# Patient Record
Sex: Female | Born: 1960
Health system: Southern US, Community
[De-identification: ages and names within clinical notes are randomized; demographics above are authoritative.]

## PROBLEM LIST (undated history)

## (undated) DIAGNOSIS — F32A Depression, unspecified: Secondary | ICD-10-CM

## (undated) DIAGNOSIS — I10 Essential (primary) hypertension: Secondary | ICD-10-CM

## (undated) DIAGNOSIS — E785 Hyperlipidemia, unspecified: Secondary | ICD-10-CM

## (undated) DIAGNOSIS — Z5189 Encounter for other specified aftercare: Secondary | ICD-10-CM

## (undated) DIAGNOSIS — K219 Gastro-esophageal reflux disease without esophagitis: Secondary | ICD-10-CM

## (undated) DIAGNOSIS — F329 Major depressive disorder, single episode, unspecified: Secondary | ICD-10-CM

## (undated) DIAGNOSIS — Z87442 Personal history of urinary calculi: Secondary | ICD-10-CM

## (undated) DIAGNOSIS — J189 Pneumonia, unspecified organism: Secondary | ICD-10-CM

## (undated) HISTORY — DX: Hyperlipidemia, unspecified: E78.5

## (undated) HISTORY — PX: HEMORRHOID SURGERY: SHX153

## (undated) HISTORY — PX: COLONOSCOPY: SHX174

## (undated) HISTORY — PX: POLYPECTOMY: SHX149

## (undated) HISTORY — DX: Major depressive disorder, single episode, unspecified: F32.9

## (undated) HISTORY — PX: TONSILLECTOMY: SUR1361

## (undated) HISTORY — DX: Gastro-esophageal reflux disease without esophagitis: K21.9

## (undated) HISTORY — DX: Depression, unspecified: F32.A

## (undated) HISTORY — DX: Encounter for other specified aftercare: Z51.89

## (undated) HISTORY — DX: Essential (primary) hypertension: I10

---

## 1998-08-14 HISTORY — PX: WISDOM TOOTH EXTRACTION: SHX21

## 1999-02-23 ENCOUNTER — Emergency Department (HOSPITAL_COMMUNITY): Admission: EM | Admit: 1999-02-23 | Discharge: 1999-02-23 | Payer: Self-pay | Admitting: Emergency Medicine

## 1999-02-24 ENCOUNTER — Encounter: Payer: Self-pay | Admitting: Emergency Medicine

## 1999-09-14 ENCOUNTER — Encounter (INDEPENDENT_AMBULATORY_CARE_PROVIDER_SITE_OTHER): Payer: Self-pay | Admitting: Specialist

## 1999-09-14 ENCOUNTER — Other Ambulatory Visit: Admission: RE | Admit: 1999-09-14 | Discharge: 1999-09-14 | Payer: Self-pay | Admitting: *Deleted

## 2000-08-24 ENCOUNTER — Other Ambulatory Visit: Admission: RE | Admit: 2000-08-24 | Discharge: 2000-08-24 | Payer: Self-pay | Admitting: Obstetrics and Gynecology

## 2001-07-04 ENCOUNTER — Ambulatory Visit (HOSPITAL_BASED_OUTPATIENT_CLINIC_OR_DEPARTMENT_OTHER): Admission: RE | Admit: 2001-07-04 | Discharge: 2001-07-05 | Payer: Self-pay | Admitting: Surgery

## 2001-07-04 ENCOUNTER — Encounter (INDEPENDENT_AMBULATORY_CARE_PROVIDER_SITE_OTHER): Payer: Self-pay | Admitting: *Deleted

## 2005-08-01 ENCOUNTER — Emergency Department: Payer: Self-pay | Admitting: Emergency Medicine

## 2006-06-12 ENCOUNTER — Emergency Department (HOSPITAL_COMMUNITY): Admission: EM | Admit: 2006-06-12 | Discharge: 2006-06-13 | Payer: Self-pay | Admitting: Emergency Medicine

## 2006-08-16 ENCOUNTER — Other Ambulatory Visit: Admission: RE | Admit: 2006-08-16 | Discharge: 2006-08-16 | Payer: Self-pay | Admitting: Obstetrics and Gynecology

## 2006-08-31 ENCOUNTER — Encounter: Admission: RE | Admit: 2006-08-31 | Discharge: 2006-08-31 | Payer: Self-pay | Admitting: Obstetrics and Gynecology

## 2007-08-15 LAB — CONVERTED CEMR LAB: Pap Smear: ABNORMAL

## 2007-08-15 LAB — HM PAP SMEAR

## 2007-09-15 LAB — HM MAMMOGRAPHY: HM Mammogram: NORMAL

## 2008-07-20 ENCOUNTER — Ambulatory Visit: Payer: Self-pay | Admitting: Family Medicine

## 2008-07-20 DIAGNOSIS — T678XXA Other effects of heat and light, initial encounter: Secondary | ICD-10-CM | POA: Insufficient documentation

## 2008-07-20 DIAGNOSIS — F329 Major depressive disorder, single episode, unspecified: Secondary | ICD-10-CM | POA: Insufficient documentation

## 2008-07-20 DIAGNOSIS — I10 Essential (primary) hypertension: Secondary | ICD-10-CM | POA: Insufficient documentation

## 2008-08-11 ENCOUNTER — Ambulatory Visit: Payer: Self-pay | Admitting: Family Medicine

## 2008-08-12 LAB — CONVERTED CEMR LAB
BUN: 15 mg/dL (ref 6–23)
CO2: 31 meq/L (ref 19–32)
Calcium: 8.8 mg/dL (ref 8.4–10.5)
Chloride: 102 meq/L (ref 96–112)
Cholesterol: 191 mg/dL (ref 0–200)
Creatinine, Ser: 0.7 mg/dL (ref 0.4–1.2)
GFR calc Af Amer: 116 mL/min
GFR calc non Af Amer: 96 mL/min
Glucose, Bld: 103 mg/dL — ABNORMAL HIGH (ref 70–99)
HDL: 34.5 mg/dL — ABNORMAL LOW (ref >39.0)
LDL Cholesterol: 141 mg/dL — ABNORMAL HIGH (ref 0–99)
Potassium: 3.6 meq/L (ref 3.5–5.1)
Sodium: 139 meq/L (ref 135–145)
TSH: 1.67 microintl units/mL (ref 0.35–5.50)
Total CHOL/HDL Ratio: 5.5
Triglycerides: 78 mg/dL (ref 0–149)
VLDL: 16 mg/dL (ref 0–40)

## 2008-10-15 ENCOUNTER — Ambulatory Visit: Payer: Self-pay | Admitting: Family Medicine

## 2008-10-21 ENCOUNTER — Ambulatory Visit: Payer: Self-pay | Admitting: Family Medicine

## 2008-10-21 DIAGNOSIS — R109 Unspecified abdominal pain: Secondary | ICD-10-CM | POA: Insufficient documentation

## 2008-11-02 ENCOUNTER — Encounter: Payer: Self-pay | Admitting: Obstetrics and Gynecology

## 2008-11-02 ENCOUNTER — Ambulatory Visit: Payer: Self-pay | Admitting: Obstetrics and Gynecology

## 2010-09-04 ENCOUNTER — Encounter: Payer: Self-pay | Admitting: Obstetrics and Gynecology

## 2010-12-14 ENCOUNTER — Encounter: Payer: Self-pay | Admitting: Family Medicine

## 2010-12-15 ENCOUNTER — Encounter: Payer: Self-pay | Admitting: Family Medicine

## 2010-12-15 ENCOUNTER — Ambulatory Visit (INDEPENDENT_AMBULATORY_CARE_PROVIDER_SITE_OTHER): Payer: BC Managed Care – PPO | Admitting: Family Medicine

## 2010-12-15 VITALS — BP 132/90 | HR 87 | Temp 98.9°F | Ht 66.0 in | Wt 187.1 lb

## 2010-12-15 DIAGNOSIS — T678XXA Other effects of heat and light, initial encounter: Secondary | ICD-10-CM

## 2010-12-15 DIAGNOSIS — I10 Essential (primary) hypertension: Secondary | ICD-10-CM

## 2010-12-15 DIAGNOSIS — Z23 Encounter for immunization: Secondary | ICD-10-CM

## 2010-12-15 MED ORDER — HEPATITIS B VAC RECOMBINANT 5 MCG/0.5ML IJ SUSP: 0.5000 mL | Freq: Once | INTRAMUSCULAR | Status: DC

## 2010-12-15 MED ORDER — LISINOPRIL 10 MG PO TABS: 10.0000 mg | ORAL_TABLET | Freq: Every day | ORAL | Status: DC

## 2010-12-15 NOTE — Progress Notes (Signed)
50 year old female:  142/88 HTN: Has been reluctant to start meds and reluctant regarding diagnosis in the past Has machine at home, confirmed BP No CP, no sob. No HA.  BP Readings from Last 3 Encounters:  12/15/10 132/90  10/21/08 130/100  07/20/08 140/100    Hot flashes -- some over the counter products. Black Cohosh and others with good success  Also, patient needs Hep B #3  GYN is doing paps, breast, mammo  The PMH, PSH, Social History, Family History, Medications, and allergies have been reviewed in [facility], and have been updated if relevant.  ROS: GEN: No acute illnesses, no fevers, chills. GI: No n/v/d, eating normally Pulm: No SOB Interactive and getting along well at home.  Otherwise, ROS is as per the HPI.  GEN: WDWN, NAD, Non-toxic, A & O x 3 HEENT: Atraumatic, Normocephalic. Neck supple. No masses, No LAD. Ears and Nose: No external deformity. CV: RRR, No M/G/R. No JVD. No thrill. No extra heart sounds. PULM: CTA B, no wheezes, crackles, rhonchi. No retractions. No resp. distress. No accessory muscle use. EXTR: No c/c/e NEURO Normal gait.  PSYCH: Normally interactive. Conversant. Not depressed or anxious appearing.  Calm demeanor.   A/P: HTN: start lisinopril Hot flashes, continue with black cohosh. We discussed other remedies. Discussed potential antidepressants. We also discussed prior use of estrogens in the past, and but was together came to the conclusion that this is not the RIGHT choice for this patient.  Hepatitis B vaccine #3

## 2010-12-27 NOTE — Assessment & Plan Note (Signed)
NAME:  Joy Yates, MOFFITT NO.:  [id number]   MEDICAL RECORD NO.:  [id number]          PATIENT TYPE:  POB   LOCATION:  [facility]         FACILITY:  [facility]   PHYSICIAN:  Argentina Donovan, MD        DATE OF BIRTH:  28-Feb-1961   DATE OF SERVICE:  11/02/2008                                  CLINIC NOTE   The patient is a 50 year old Caucasian female, nulligravida, married,  and works in [employer].  Since she had her menarche at age 14, she  has only had 2-3 periods a year, has been fully worked up in the past,  and had seen infertility specialist many years ago who wanted to place  her on [facility] and thus she decided against it because of price.  She is in  extremely good health.   She has no known allergies.   The only medication she is on now is penicillin because she just  recently had a root canal work.   The only surgery she has had is 2 hemorrhoid operations and has had no  problems since then.   FAMILY HISTORY:  Relatively negative.  No sign of gynecological, rectal,  or colon cancer.  Her mother died in last 07/02/2023 of a ruptured  cerebral aneurysm, but had been well up until that point.   REVIEW OF SYSTEMS:  Negative with the exception of what she calls body  heat, had hot flashes, but she always feels well, and has no  gynecological complaints.   PHYSICAL EXAMINATION:  GENERAL:  This is a well-developed slightly obese  white female in no acute distress.  HEENT:  Within normal limits.  VITAL SIGNS:  Blood pressure is 128/92, weight is 173, patient is 5 feet  6 inches tall, and pulse is 74 beat per minute.  NECK:  Thyroid is symmetrical with no masses.  Neck is supple.  Neck is  erect.  BREASTS:  Symmetrical with no dominant masses.  No nipple discharge.  No  supraclavicular nor axillary nodes.  LUNGS:  Clear to auscultation and percussion.  HEART:  No murmur.  Normal sinus rhythm.  ABDOMEN:  Soft, flat, nontender.  No masses or organomegaly.  No  sign of  hirsutism.  EXTREMITIES:  No edema.  No varicosities.  NEUROLOGIC:  DTRs within normal limits.  GENITALIA:  External genitalia is normal pubic hair, having been shaved.  BUS within normal limits.  Introitus is marital.  Vagina is clean and  well rugated.  Cervix is clean, nulliparous.  Uterus is of normal size,  shape, consistency.  Anterior flexed free movable.  The adnexa could not  be well palpated.   IMPRESSION:  Normal gynecological examination.  Pap smear was taken.  The patient will be referred for mammogram.           ______________________________  Argentina Donovan, MD     PR/MEDQ  D:  11/02/2008  T:  11/03/2008  Job:  [id number]

## 2010-12-30 NOTE — Op Note (Signed)
[facility]  Patient:    Joy Yates, Joy Yates Visit Number: [id number] MRN: [id number]          Service Type: Attending:  Currie Paris, M.D. Dictated by:   Currie Paris, M.D. Proc. Date: 07/04/01   CC:         Teena Irani. Arlyce Dice, M.D.   Operative Report  ACCOUNT NO. [id number]. CCS [id number].  PREOPERATIVE DIAGNOSIS:  Recurrent hemorrhoids with multiple skin tags.  POSTOPERATIVE DIAGNOSIS:  Recurrent hemorrhoids with multiple skin tags.  PROCEDURE:  Hemorrhoidectomy and excision of associated skin tags.  SURGEON:  Currie Paris, M.D.  ANESTHESIA:  General.  CLINICAL HISTORY:  This patient has presented to the office with thrombosed external hemorrhoids, which had been drained, but also had multiple skin tags and two on the right, two on the left side, and some residual internal disease.  She apparently had an operative hemorrhoidectomy previously but was unable to recall who had done that surgery.  We did not have any records preoperatively, but she was continuing to have symptoms.  DESCRIPTION OF PROCEDURE:  The patient was seen in the holding area and had no further questions.  She was taken to the operating room, after satisfactory general endotracheal anesthesia had been obtained was placed in the prone, jackknife position.  The perianal area was examined, and there was a skin tag with a little hyperplastic epithelial polyp on it noted on the right anterior aspect.  There were really four tags, right anterior, left anterior, right posterior, left posterior, noted.  There was also some internal hemorrhoid disease associated with these bulged out a little bit.  Digital rectal exam was unremarkable.  Anoscopy showed continuation of the hemorrhoidal disease associated with these skin tags, but there were not any real large columns residual.  The area was injected with 0.25% Marcaine with epinephrine to help with postop analgesia  and to reduce bleeding during surgery.  I began with the left anterior hemorrhoid and skin tag and elevated the skin tag and made a V-shaped incision, stayed very superficial, identified the muscle, peeled the hemorrhoidal tag and scar tissue off the underlying muscle, and then dissected up under the hemorrhoid in the mucosa.  I placed a crown stitch at the very beginning and then once the residual hemorrhoidal tissue was excised, closed the mucosa to the mucocutaneous border with the chromic and then the epidermis with a 4-0 Vicryl.  I then did the right posterior, right anterior, and left posterior in similar fashions.  Each time I tried to take a minimal of skin dermis and a minimal of mucosa, mainly undermining and pulling out the hemorrhoidal tissue, trying to preserve as much mucosa and skin as possible.  At the conclusion, everything appeared to be dry.  The mucosa was intact.  The rectal area easily admitted three fingers, and there appeared to be no other problems.  Sterile dressings were applied.  I packed a little Gelfoam in the rectum.  She tolerated the procedure well.  There were no operative complications. Dictated by:   Currie Paris, M.D. Attending:  Currie Paris, M.D. DD:  07/04/01 TD:  07/04/01 Job: [id number]

## 2011-02-20 ENCOUNTER — Other Ambulatory Visit (INDEPENDENT_AMBULATORY_CARE_PROVIDER_SITE_OTHER): Payer: BC Managed Care – PPO

## 2011-02-20 ENCOUNTER — Other Ambulatory Visit: Payer: Self-pay | Admitting: Obstetrics and Gynecology

## 2011-02-20 DIAGNOSIS — R310 Gross hematuria: Secondary | ICD-10-CM

## 2011-02-22 LAB — URINE CULTURE: Colony Count: >=100000

## 2011-03-09 ENCOUNTER — Encounter: Payer: Self-pay | Admitting: Gynecology

## 2011-03-13 ENCOUNTER — Encounter: Payer: Self-pay | Admitting: Obstetrics and Gynecology

## 2011-03-13 ENCOUNTER — Ambulatory Visit (INDEPENDENT_AMBULATORY_CARE_PROVIDER_SITE_OTHER): Payer: BC Managed Care – PPO | Admitting: Obstetrics and Gynecology

## 2011-03-13 VITALS — BP 120/82 | HR 91 | Ht 66.0 in | Wt 187.0 lb

## 2011-03-13 DIAGNOSIS — R8279 Other abnormal findings on microbiological examination of urine: Secondary | ICD-10-CM

## 2011-03-13 DIAGNOSIS — Z0000 Encounter for general adult medical examination without abnormal findings: Secondary | ICD-10-CM

## 2011-03-13 DIAGNOSIS — R82998 Other abnormal findings in urine: Secondary | ICD-10-CM

## 2011-03-13 LAB — POCT URINALYSIS DIPSTICK
Bilirubin, UA: NEGATIVE
Blood, UA: NEGATIVE
Glucose, UA: NEGATIVE
Ketones, UA: NEGATIVE
Leukocytes, UA: NEGATIVE
Nitrite, UA: NEGATIVE
Protein, UA: NEGATIVE
Urobilinogen, UA: NEGATIVE

## 2011-03-13 NOTE — Progress Notes (Signed)
This patient has no complaints. She's a 50 year old nulligravida white female married and works at [employer]. Chest menarche age 59 she still had 2-3 periods a year and Foley we'll workup in the past for infertility specialist many years ago her periods come only about 2 times a year her last period was 6 months ago she's always had normal Pap smears and. In 2 years since she had a mammogram so we'll order one of those today. She recently had a urinary tract infection was treated with Cipro twice a day for 30 days 500 mg of peridium 200 mg 3 times a day. A repeat urine will be checked today for 2 or.  Review of systems is negative with the exception of some skin rash that she recently had diagnoses acne by a dermatologist and is under treatment at the present time.  Physical examination well-developed slightly obese white female in no acute distress. HEENT: Within normal limits PERRLA. Neck: Supple, with a symmetrical thyroid and no retinal dominant masses. Back: Direct. Breasts: Symmetrical no dominant masses, no nipple discharge, no supraclavicular nor axillary nodes. Heart: Normal sinus rhythm no murmur, PMI is in the fifth intercostal space and mid clavicular line. Lungs: Clear to auscultation and percussion. Abdomen soft flat nontender no masses no organomegaly. Extremities: No edema no varicosities. Neurological: DTRs within normal limits. Genitalia: External normal. Bartholin's, urethra, skin glands, within normal limits. Introitus marital. Vagina: clean and well rugated. Cervix: Clean nulliparous. Uterus: Anterior normal size shape and consistency. Adnexa: Not well outlined because of the habitus of the patient.

## 2011-03-13 NOTE — Progress Notes (Signed)
Addended by: Barbara Cower on: 03/13/2011 02:22 PM   Modules accepted: Orders

## 2011-03-27 ENCOUNTER — Ambulatory Visit: Payer: BC Managed Care – PPO

## 2011-04-05 ENCOUNTER — Ambulatory Visit
Admission: RE | Admit: 2011-04-05 | Discharge: 2011-04-05 | Disposition: A | Discharge status: Home / Self Care | Payer: BC Managed Care – PPO | Source: Ambulatory Visit | Attending: Obstetrics and Gynecology | Admitting: Obstetrics and Gynecology

## 2011-04-05 ENCOUNTER — Other Ambulatory Visit: Payer: Self-pay | Admitting: Obstetrics and Gynecology

## 2011-04-05 DIAGNOSIS — Z0000 Encounter for general adult medical examination without abnormal findings: Secondary | ICD-10-CM

## 2011-12-07 ENCOUNTER — Other Ambulatory Visit: Payer: Self-pay | Admitting: Family Medicine

## 2012-01-10 ENCOUNTER — Other Ambulatory Visit: Payer: Self-pay | Admitting: Family Medicine

## 2012-02-14 ENCOUNTER — Other Ambulatory Visit: Payer: Self-pay | Admitting: Family Medicine

## 2012-02-22 ENCOUNTER — Other Ambulatory Visit: Payer: Self-pay | Admitting: Family Medicine

## 2012-02-22 DIAGNOSIS — I10 Essential (primary) hypertension: Secondary | ICD-10-CM

## 2012-02-22 DIAGNOSIS — Z13220 Encounter for screening for lipoid disorders: Secondary | ICD-10-CM

## 2012-02-22 DIAGNOSIS — Z79899 Other long term (current) drug therapy: Secondary | ICD-10-CM

## 2012-02-28 ENCOUNTER — Other Ambulatory Visit (INDEPENDENT_AMBULATORY_CARE_PROVIDER_SITE_OTHER): Payer: BC Managed Care – PPO

## 2012-02-28 DIAGNOSIS — Z13220 Encounter for screening for lipoid disorders: Secondary | ICD-10-CM

## 2012-02-28 DIAGNOSIS — I10 Essential (primary) hypertension: Secondary | ICD-10-CM

## 2012-02-28 DIAGNOSIS — Z79899 Other long term (current) drug therapy: Secondary | ICD-10-CM

## 2012-02-28 LAB — CBC WITH DIFFERENTIAL/PLATELET
Basophils Absolute: 0 10*3/uL (ref 0.0–0.1)
Basophils Relative: 0.1 % (ref 0.0–3.0)
Eosinophils Absolute: 0.4 10*3/uL (ref 0.0–0.7)
Eosinophils Relative: 6.2 % — ABNORMAL HIGH (ref 0.0–5.0)
HCT: 45.3 % (ref 36.0–46.0)
Hemoglobin: 15.5 g/dL — ABNORMAL HIGH (ref 12.0–15.0)
Lymphocytes Relative: 23.7 % (ref 12.0–46.0)
Lymphs Abs: 1.7 10*3/uL (ref 0.7–4.0)
MCHC: 34.3 g/dL (ref 30.0–36.0)
MCV: 88.1 fl (ref 78.0–100.0)
Monocytes Absolute: 0.7 10*3/uL (ref 0.1–1.0)
Monocytes Relative: 9.9 % (ref 3.0–12.0)
Neutro Abs: 4.2 10*3/uL (ref 1.4–7.7)
Neutrophils Relative %: 60.1 % (ref 43.0–77.0)
Platelets: 258 10*3/uL (ref 150.0–400.0)
RBC: 5.14 Mil/uL — ABNORMAL HIGH (ref 3.87–5.11)
RDW: 13.2 % (ref 11.5–14.6)
WBC: 7 10*3/uL (ref 4.5–10.5)

## 2012-02-28 LAB — LIPID PANEL
Cholesterol: 223 mg/dL — ABNORMAL HIGH (ref 0–200)
HDL: 49.1 mg/dL (ref >39.00)
Total CHOL/HDL Ratio: 5
Triglycerides: 165 mg/dL — ABNORMAL HIGH (ref 0.0–149.0)
VLDL: 33 mg/dL (ref 0.0–40.0)

## 2012-02-28 LAB — BASIC METABOLIC PANEL
BUN: 11 mg/dL (ref 6–23)
CO2: 25 mEq/L (ref 19–32)
Calcium: 9.3 mg/dL (ref 8.4–10.5)
Chloride: 102 mEq/L (ref 96–112)
Creatinine, Ser: 0.7 mg/dL (ref 0.4–1.2)
GFR: 92.77 mL/min (ref >60.00)
Glucose, Bld: 94 mg/dL (ref 70–99)
Potassium: 3.7 mEq/L (ref 3.5–5.1)
Sodium: 139 mEq/L (ref 135–145)

## 2012-02-28 LAB — HEPATIC FUNCTION PANEL
ALT: 28 U/L (ref 0–35)
AST: 21 U/L (ref 0–37)
Albumin: 4.2 g/dL (ref 3.5–5.2)
Alkaline Phosphatase: 57 U/L (ref 39–117)
Bilirubin, Direct: 0.1 mg/dL (ref 0.0–0.3)
Total Bilirubin: 0.5 mg/dL (ref 0.3–1.2)
Total Protein: 6.8 g/dL (ref 6.0–8.3)

## 2012-02-28 LAB — LDL CHOLESTEROL, DIRECT: Direct LDL: 151.5 mg/dL

## 2012-02-28 LAB — TSH: TSH: 1.65 u[IU]/mL (ref 0.35–5.50)

## 2012-03-11 ENCOUNTER — Ambulatory Visit (INDEPENDENT_AMBULATORY_CARE_PROVIDER_SITE_OTHER): Payer: BC Managed Care – PPO | Admitting: Family Medicine

## 2012-03-11 ENCOUNTER — Encounter: Payer: Self-pay | Admitting: Family Medicine

## 2012-03-11 VITALS — BP 110/74 | HR 72 | Temp 97.9°F | Ht 66.0 in | Wt 182.2 lb

## 2012-03-11 DIAGNOSIS — Z0000 Encounter for general adult medical examination without abnormal findings: Secondary | ICD-10-CM

## 2012-03-11 NOTE — Progress Notes (Signed)
[facility] at [facility] [address] [place] [address] Phone: [phone] Fax: [phone]  Date:  03/11/2012   Name:  Joy Yates   DOB:  1960-10-11   MRN:  [id number]  PCP:  Hannah Beat, MD    Chief Complaint: Annual Exam   History of Present Illness:  Joy Yates is a 51 y.o. very pleasant female patient who presents with the following:  Health Maintenance Summary Reviewed and updated, unless pt declines services.  Tobacco History Reviewed. Non-smoker Alcohol: No concerns, no excessive use Exercise Habits: 2-3 times a week, some walking STD concerns: none Drug Use: None Birth control method: n/a Menses regular: no, menopause Lumps or breast concerns: no mammos in august  Health Maintenance  Topic Date Due  . Influenza Vaccine  05/14/2012  . Tetanus/tdap  08/14/2013  . Pap Smear  03/11/2014    Labs reviewed with the patient.  Results for orders placed in visit on 02/28/12  LIPID PANEL      Component Value Range   Cholesterol 223 (*) 0 - 200 mg/dL   Triglycerides 244.0 (*) 0.0 - 149.0 mg/dL   HDL 10.27  >25.36 mg/dL   VLDL 64.4  0.0 - 03.4 mg/dL   Total CHOL/HDL Ratio 5    HEPATIC FUNCTION PANEL      Component Value Range   Total Bilirubin 0.5  0.3 - 1.2 mg/dL   Bilirubin, Direct 0.1  0.0 - 0.3 mg/dL   Alkaline Phosphatase 57  39 - 117 U/L   AST 21  0 - 37 U/L   ALT 28  0 - 35 U/L   Total Protein 6.8  6.0 - 8.3 g/dL   Albumin 4.2  3.5 - 5.2 g/dL  TSH      Component Value Range   TSH 1.65  0.35 - 5.50 uIU/mL  CBC WITH DIFFERENTIAL      Component Value Range   WBC 7.0  4.5 - 10.5 K/uL   RBC 5.14 (*) 3.87 - 5.11 Mil/uL   Hemoglobin 15.5 (*) 12.0 - 15.0 g/dL   HCT 74.2  59.5 - 63.8 %   MCV 88.1  78.0 - 100.0 fl   MCHC 34.3  30.0 - 36.0 g/dL   RDW 75.6  43.3 - 29.5 %   Platelets 258.0  150.0 - 400.0 K/uL   Neutrophils Relative 60.1  43.0 - 77.0 %   Lymphocytes Relative 23.7  12.0 - 46.0 %   Monocytes Relative 9.9  3.0 -  12.0 %   Eosinophils Relative 6.2 (*) 0.0 - 5.0 %   Basophils Relative 0.1  0.0 - 3.0 %   Neutro Abs 4.2  1.4 - 7.7 K/uL   Lymphs Abs 1.7  0.7 - 4.0 K/uL   Monocytes Absolute 0.7  0.1 - 1.0 K/uL   Eosinophils Absolute 0.4  0.0 - 0.7 K/uL   Basophils Absolute 0.0  0.0 - 0.1 K/uL  BASIC METABOLIC PANEL      Component Value Range   Sodium 139  135 - 145 mEq/L   Potassium 3.7  3.5 - 5.1 mEq/L   Chloride 102  96 - 112 mEq/L   CO2 25  19 - 32 mEq/L   Glucose, Bld 94  70 - 99 mg/dL   BUN 11  6 - 23 mg/dL   Creatinine, Ser 0.7  0.4 - 1.2 mg/dL   Calcium 9.3  8.4 - 18.8 mg/dL   GFR 41.66  >06.30 mL/min  LDL CHOLESTEROL,  DIRECT      Component Value Range   Direct LDL 151.5       Patient Active Problem List  Diagnosis  . DEPRESSIVE DISORDER  . HYPERTENSION, ESSENTIAL NOS  . HEAT INTOLERANCE    Past Medical History  Diagnosis Date  . Hypertension   . Depression     Past Surgical History  Procedure Date  . Tonsillectomy   . Hemorrhoid surgery     X 2  . Wisdom tooth extraction 2000    History  Substance Use Topics  . Smoking status: Never Smoker   . Smokeless tobacco: Never Used  . Alcohol Use: Yes     wine    Family History  Problem Relation Age of Onset  . Aneurysm Mother   . Cerebral aneurysm Mother     NOVEMVER 2009  . Hypertension Mother   . Hypertension Sister   . Hypertension Brother     No Known Allergies  Medication list has been reviewed and updated.  Current Outpatient Prescriptions on File Prior to Visit  Medication Sig Dispense Refill  . lisinopril (PRINIVIL,ZESTRIL) 10 MG tablet *NEEDS APPOINTMENT FOR AN FURTHER REFILLS*  30 tablet  0  . Multiple Vitamin (MULTIVITAMIN) tablet Take 1 tablet by mouth daily.        . ST JOHNS WORT PO Take by mouth.         Current Facility-Administered Medications on File Prior to Visit  Medication Dose Route Frequency Provider Last Rate Last Dose  . hepatitis B vac recombinant (RECOMBIVAX) injection 5 mcg   0.5 mL Intramuscular Once Hannah Beat, MD        Review of Systems:   General: Denies fever, chills, sweats. No significant weight loss. Eyes: Denies blurring,significant itching ENT: Denies earache, sore throat, and hoarseness.  Cardiovascular: Denies chest pains, palpitations, dyspnea on exertion,  Respiratory: Denies cough, dyspnea at rest,wheeezing Breast: no concerns about lumps GI: Denies nausea, vomiting, diarrhea, constipation, change in bowel habits, abdominal pain, melena, hematochezia GU: Denies dysuria, hematuria, urinary hesitancy, nocturia, denies STD risk, no concerns about discharge Musculoskeletal: Denies back pain, joint pain Derm: Denies rash, itching Neuro: Denies  paresthesias, frequent falls, frequent headaches Psych: Denies depression, anxiety Endocrine: Denies cold intolerance, heat intolerance, polydipsia Heme: Denies enlarged lymph nodes Allergy: No hayfever   Physical Examination: Filed Vitals:   03/11/12 0852  BP: 110/74  Pulse: 72  Temp: 97.9 F (36.6 C)   Filed Vitals:   03/11/12 0852  Height: 5\' 6"  (1.676 m)  Weight: 182 lb 4 oz (82.668 kg)   Body mass index is 29.42 kg/(m^2). Ideal Body Weight: Weight in (lb) to have BMI = 25: 154.6    GEN: well developed, well nourished, no acute distress Eyes: conjunctiva and lids normal, PERRLA, EOMI ENT: TM clear, nares clear, oral exam WNL Neck: supple, no lymphadenopathy, no thyromegaly, no JVD Pulm: clear to auscultation and percussion, respiratory effort normal CV: regular rate and rhythm, S1-S2, no murmur, rub or gallop, no bruits Chest: no scars, masses, no lumps BREAST: breast exam declined GI: soft, non-tender; no hepatosplenomegaly, masses; active bowel sounds all quadrants GU: GU exam declined Lymph: no cervical, axillary or inguinal adenopathy MSK: gait normal, muscle tone and strength WNL, no joint swelling, effusions, discoloration, crepitus  SKIN: clear, good turgor, color WNL,  no rashes, lesions, or ulcerations Neuro: normal mental status, normal strength, sensation, and motion Psych: alert; oriented to person, place and time, normally interactive and not anxious or depressed in appearance.  Assessment and Plan: 1. Routine general medical examination at a health care facility     The patient's preventative maintenance and recommended screening tests for an annual wellness exam were reviewed in full today. Brought up to date unless services declined.  Counselled on the importance of diet, exercise, and its role in overall health and mortality. The patient's FH and SH was reviewed, including their home life, tobacco status, and drug and alcohol status.   Work on diet, exercise. Work on your diet: Try to eat minimal fatty food and eat more fruits and vegetables. Anything fried is bad, cream, beef and pork fat are bad. Exercise is incredibly important to heart health. Try to exercise for at least 30 minutes 5 - 6 times a week   Hannah Beat, MD

## 2012-03-15 ENCOUNTER — Other Ambulatory Visit: Payer: Self-pay | Admitting: Family Medicine

## 2012-09-25 ENCOUNTER — Encounter: Payer: Self-pay | Admitting: Family Medicine

## 2012-09-25 ENCOUNTER — Ambulatory Visit (INDEPENDENT_AMBULATORY_CARE_PROVIDER_SITE_OTHER): Payer: BC Managed Care – PPO | Admitting: Family Medicine

## 2012-09-25 VITALS — BP 104/83 | HR 91 | Temp 98.1°F | Ht 66.0 in

## 2012-09-25 DIAGNOSIS — R42 Dizziness and giddiness: Secondary | ICD-10-CM

## 2012-09-25 LAB — CBC WITH DIFFERENTIAL/PLATELET
Basophils Absolute: 0 10*3/uL (ref 0.0–0.1)
Basophils Relative: 0.4 % (ref 0.0–3.0)
Eosinophils Absolute: 0.6 10*3/uL (ref 0.0–0.7)
Eosinophils Relative: 9.5 % — ABNORMAL HIGH (ref 0.0–5.0)
HCT: 43.9 % (ref 36.0–46.0)
Hemoglobin: 15.3 g/dL — ABNORMAL HIGH (ref 12.0–15.0)
Lymphocytes Relative: 21.2 % (ref 12.0–46.0)
Lymphs Abs: 1.4 10*3/uL (ref 0.7–4.0)
MCHC: 34.8 g/dL (ref 30.0–36.0)
MCV: 86.2 fL (ref 78.0–100.0)
Monocytes Absolute: 0.5 10*3/uL (ref 0.1–1.0)
Monocytes Relative: 8 % (ref 3.0–12.0)
Neutro Abs: 3.9 10*3/uL (ref 1.4–7.7)
Neutrophils Relative %: 60.9 % (ref 43.0–77.0)
Platelets: 271 10*3/uL (ref 150.0–400.0)
RBC: 5.09 Mil/uL (ref 3.87–5.11)
RDW: 12.7 % (ref 11.5–14.6)
WBC: 6.5 10*3/uL (ref 4.5–10.5)

## 2012-09-25 LAB — HEPATIC FUNCTION PANEL
ALT: 29 U/L (ref 0–35)
AST: 21 U/L (ref 0–37)
Albumin: 4.2 g/dL (ref 3.5–5.2)
Alkaline Phosphatase: 57 U/L (ref 39–117)
Bilirubin, Direct: 0.1 mg/dL (ref 0.0–0.3)
Total Bilirubin: 0.7 mg/dL (ref 0.3–1.2)
Total Protein: 7 g/dL (ref 6.0–8.3)

## 2012-09-25 LAB — BASIC METABOLIC PANEL
BUN: 19 mg/dL (ref 6–23)
CO2: 27 meq/L (ref 19–32)
Calcium: 9.3 mg/dL (ref 8.4–10.5)
Chloride: 103 meq/L (ref 96–112)
Creatinine, Ser: 0.8 mg/dL (ref 0.4–1.2)
GFR: 79.5 mL/min (ref >60.00)
Glucose, Bld: 86 mg/dL (ref 70–99)
Potassium: 3.9 mEq/L (ref 3.5–5.1)
Sodium: 138 meq/L (ref 135–145)

## 2012-09-25 NOTE — Progress Notes (Signed)
[facility] [address] [place] [address] Phone: [phone] Fax: [phone]  Date:  09/25/2012   Name:  Joy Yates   DOB:  04-Sep-1960   MRN:  [id number] Gender: female Age: 52 y.o.  Primary Physician:  Hannah Beat, MD  Evaluating MD: Hannah Beat, MD   Chief Complaint: Dizziness   History of Present Illness:  Joy Yates is a 52 y.o. pleasant patient who presents with the following:  Dizziness / lightheadedness: From head to back of head, will feel lightheaded.  Just got a playstation.  Feels lightheaded some, feels like going to pass out. Feels like has felt like that most of the time, but it has been ongoing for the last 6 months. Not associated with movement, but she will often feel like that. Denies true vertigo. No spinning of the room, she is not spinning.   Mother had a history of an aneurysm  She has not had altered memory, gait disturbance, errors at work. No other neurological symptoms.   Patient Active Problem List  Diagnosis  . DEPRESSIVE DISORDER  . HYPERTENSION, ESSENTIAL NOS  . HEAT INTOLERANCE    Past Medical History  Diagnosis Date  . Hypertension   . Depression     Past Surgical History  Procedure Laterality Date  . Tonsillectomy    . Hemorrhoid surgery      X 2  . Wisdom tooth extraction  2000    History  Substance Use Topics  . Smoking status: Never Smoker   . Smokeless tobacco: Never Used  . Alcohol Use: Yes     Comment: wine    Family History  Problem Relation Age of Onset  . Aneurysm Mother   . Cerebral aneurysm Mother     NOVEMVER 2009  . Hypertension Mother   . Hypertension Sister   . Hypertension Brother     No Known Allergies  Medication list has been reviewed and updated.  Outpatient Prescriptions Prior to Visit  Medication Sig Dispense Refill  . lisinopril (PRINIVIL,ZESTRIL) 10 MG tablet TAKE 1 TABLET BY MOUTH DAILY  30 tablet  11  . Multiple Vitamin  (MULTIVITAMIN) tablet Take 1 tablet by mouth daily.        . ST JOHNS WORT PO Take by mouth.        Marland Kitchen lisinopril (PRINIVIL,ZESTRIL) 10 MG tablet *NEEDS APPOINTMENT FOR AN FURTHER REFILLS*  30 tablet  0   Facility-Administered Medications Prior to Visit  Medication Dose Route Frequency Provider Last Rate Last Dose  . hepatitis B vac recombinant (RECOMBIVAX) injection 5 mcg  0.5 mL Intramuscular Once Hannah Beat, MD        Review of Systems:   GEN: No acute illnesses, no fevers, chills. GI: No n/v/d, eating normally Pulm: No SOB Interactive and getting along well at home.  Otherwise, ROS is as per the HPI.   Physical Examination: BP 104/83  Pulse 91  Temp(Src) 98.1 F (36.7 C) (Oral)  Ht 5\' 6"  (1.676 m)  SpO2 98%  Ideal Body Weight: Weight in (lb) to have BMI = 25: 154.6   GEN: WDWN, NAD, Non-toxic, A & O x 3 HEENT: Atraumatic, Normocephalic. Neck supple. No masses, No LAD. Ears and Nose: No external deformity. CV: RRR, No M/G/R. No JVD. No thrill. No extra heart sounds. PULM: CTA B, no wheezes, crackles, rhonchi. No retractions. No resp. distress. No accessory muscle use. ABD: S, NT, ND, +BS. No rebound tenderness. No HSM.  EXTR:  No c/c/e  Neuro: CN 2-12 grossly intact. PERRLA. EOMI. Sensation intact throughout. Str 5/5 all extremities. DTR 2+. No clonus. A and o x 4. Romberg neg. Finger nose neg. Heel -shin neg.   PSYCH: Normally interactive. Conversant. Not depressed or anxious appearing.  Calm demeanor.    Assessment and Plan:  1. Dizziness and giddiness  Basic metabolic panel   Basic metabolic panel   CBC with Differential   Hepatic function panel   Most likely low BP, d/c lisinopril. Pt has lost 10 pounds Rule out anemia, electrolyte disturbance, DM.  If sx do not resolve with d/c BP meds, then discussed need for brain imaging given 6 month duration.  Orders Today:  Orders Placed This Encounter  Procedures  . Basic metabolic panel  . CBC with  Differential  . Hepatic function panel    Updated Medication List: (Includes new medications, updates to list, dose adjustments) No orders of the defined types were placed in this encounter.    Medications Discontinued: Medications Discontinued During This Encounter  Medication Reason  . lisinopril (PRINIVIL,ZESTRIL) 10 MG tablet Error  . lisinopril (PRINIVIL,ZESTRIL) 10 MG tablet      Signed, Virdie Penning T. Koda Defrank, MD 09/25/2012 9:52 AM

## 2012-09-25 NOTE — Patient Instructions (Addendum)
Stop lisinopril  Call me - if your symptoms are still persisting in 2 weeks, then please call us.

## 2012-09-30 ENCOUNTER — Encounter: Payer: Self-pay | Admitting: *Deleted

## 2013-04-24 ENCOUNTER — Encounter: Payer: Self-pay | Admitting: Obstetrics and Gynecology

## 2013-04-24 ENCOUNTER — Ambulatory Visit (INDEPENDENT_AMBULATORY_CARE_PROVIDER_SITE_OTHER): Payer: BC Managed Care – PPO | Admitting: Obstetrics and Gynecology

## 2013-04-24 VITALS — BP 119/90 | HR 80 | Ht 66.0 in | Wt 179.0 lb

## 2013-04-24 DIAGNOSIS — Z124 Encounter for screening for malignant neoplasm of cervix: Secondary | ICD-10-CM

## 2013-04-24 DIAGNOSIS — Z01419 Encounter for gynecological examination (general) (routine) without abnormal findings: Secondary | ICD-10-CM

## 2013-04-24 DIAGNOSIS — Z1151 Encounter for screening for human papillomavirus (HPV): Secondary | ICD-10-CM

## 2013-04-24 NOTE — Progress Notes (Signed)
  Subjective:     Joy Yates is a 52 y.o. female postmenopausal with BMI 28 who is here for a comprehensive physical exam. The patient reports no problems. Patient reports a 20 lb weight loss over the past 2 years and being taken off her antihypertensive medications as a result. Patient has modified her diet significantly.   History   Social History  . Marital Status: Single    Spouse Name: N/A    Number of Children: N/A  . Years of Education: N/A   Occupational History  . account specialist    Social History Main Topics  . Smoking status: Never Smoker   . Smokeless tobacco: Never Used  . Alcohol Use: Yes     Comment: wine  . Drug Use: No  . Sexual Activity: Yes   Other Topics Concern  . Not on file   Social History Narrative  . No narrative on file   Health Maintenance  Topic Date Due  . Mammogram  07/26/2012  . Colonoscopy  07/26/2012  . Influenza Vaccine  03/14/2013  . Tetanus/tdap  08/14/2013  . Pap Smear  03/11/2014   Past Medical History  Diagnosis Date  . Hypertension   . Depression    Past Surgical History  Procedure Laterality Date  . Tonsillectomy    . Hemorrhoid surgery      X 2  . Wisdom tooth extraction  2000   Family History  Problem Relation Age of Onset  . Aneurysm Mother   . Cerebral aneurysm Mother     NOVEMVER 2009  . Hypertension Mother   . Hypertension Sister   . Hypertension Brother    History  Substance Use Topics  . Smoking status: Never Smoker   . Smokeless tobacco: Never Used  . Alcohol Use: Yes     Comment: wine       Review of Systems A comprehensive review of systems was negative.   Objective:      GENERAL: Well-developed, well-nourished female in no acute distress.  HEENT: Normocephalic, atraumatic. Sclerae anicteric.  NECK: Supple. Normal thyroid.  LUNGS: Clear to auscultation bilaterally.  HEART: Regular rate and rhythm. BREASTS: Symmetric in size. No palpable masses or lymphadenopathy, skin changes, or  nipple drainage. ABDOMEN: Soft, nontender, nondistended. No organomegaly. PELVIC: Normal external female genitalia. Vagina is pink and rugated.  Normal discharge. Normal appearing cervix. Uterus is normal in size. No adnexal mass or tenderness. EXTREMITIES: No cyanosis, clubbing, or edema, 2+ distal pulses.    Assessment:    Healthy female exam.      Plan:     Pap smear collected Patient advised to perform monthly self breast and vulva exam Encouraged continued weight loss efforts See After Visit Summary for Counseling Recommendations

## 2013-04-24 NOTE — Patient Instructions (Signed)
Preventive Care for Adults, Female A healthy lifestyle and preventive care can promote health and wellness. Preventive health guidelines for women include the following key practices.  A routine yearly physical is a good way to check with your caregiver about your health and preventive screening. It is a chance to share any concerns and updates on your health, and to receive a thorough exam.  Visit your dentist for a routine exam and preventive care every 6 months. Brush your teeth twice a day and floss once a day. Good oral hygiene prevents tooth decay and gum disease.  The frequency of eye exams is based on your age, health, family medical history, use of contact lenses, and other factors. Follow your caregiver's recommendations for frequency of eye exams.  Eat a healthy diet. Foods like vegetables, fruits, whole grains, low-fat dairy products, and lean protein foods contain the nutrients you need without too many calories. Decrease your intake of foods high in solid fats, added sugars, and salt. Eat the right amount of calories for you.Get information about a proper diet from your caregiver, if necessary.  Regular physical exercise is one of the most important things you can do for your health. Most adults should get at least 150 minutes of moderate-intensity exercise (any activity that increases your heart rate and causes you to sweat) each week. In addition, most adults need muscle-strengthening exercises on 2 or more days a week.  Maintain a healthy weight. The body mass index (BMI) is a screening tool to identify possible weight problems. It provides an estimate of body fat based on height and weight. Your caregiver can help determine your BMI, and can help you achieve or maintain a healthy weight.For adults 20 years and older:  A BMI below 18.5 is considered underweight.  A BMI of 18.5 to 24.9 is normal.  A BMI of 25 to 29.9 is considered overweight.  A BMI of 30 and above is  considered obese.  Maintain normal blood lipids and cholesterol levels by exercising and minimizing your intake of saturated fat. Eat a balanced diet with plenty of fruit and vegetables. Blood tests for lipids and cholesterol should begin at age 20 and be repeated every 5 years. If your lipid or cholesterol levels are high, you are over 50, or you are at high risk for heart disease, you may need your cholesterol levels checked more frequently.Ongoing high lipid and cholesterol levels should be treated with medicines if diet and exercise are not effective.  If you smoke, find out from your caregiver how to quit. If you do not use tobacco, do not start.  If you are pregnant, do not drink alcohol. If you are breastfeeding, be very cautious about drinking alcohol. If you are not pregnant and choose to drink alcohol, do not exceed 1 drink per day. One drink is considered to be 12 ounces (355 mL) of beer, 5 ounces (148 mL) of wine, or 1.5 ounces (44 mL) of liquor.  Avoid use of street drugs. Do not share needles with anyone. Ask for help if you need support or instructions about stopping the use of drugs.  High blood pressure causes heart disease and increases the risk of stroke. Your blood pressure should be checked at least every 1 to 2 years. Ongoing high blood pressure should be treated with medicines if weight loss and exercise are not effective.  If you are 55 to 51 years old, ask your caregiver if you should take aspirin to prevent strokes.  Diabetes   screening involves taking a blood sample to check your fasting blood sugar level. This should be done once every 3 years, after age 45, if you are within normal weight and without risk factors for diabetes. Testing should be considered at a younger age or be carried out more frequently if you are overweight and have at least 1 risk factor for diabetes.  Breast cancer screening is essential preventive care for women. You should practice "breast  self-awareness." This means understanding the normal appearance and feel of your breasts and may include breast self-examination. Any changes detected, no matter how small, should be reported to a caregiver. Women in their 20s and 30s should have a clinical breast exam (CBE) by a caregiver as part of a regular health exam every 1 to 3 years. After age 40, women should have a CBE every year. Starting at age 40, women should consider having a mammography (breast X-ray test) every year. Women who have a family history of breast cancer should talk to their caregiver about genetic screening. Women at a high risk of breast cancer should talk to their caregivers about having magnetic resonance imaging (MRI) and a mammography every year.  The Pap test is a screening test for cervical cancer. A Pap test can show cell changes on the cervix that might become cervical cancer if left untreated. A Pap test is a procedure in which cells are obtained and examined from the lower end of the uterus (cervix).  Women should have a Pap test starting at age 21.  Between ages 21 and 29, Pap tests should be repeated every 2 years.  Beginning at age 30, you should have a Pap test every 3 years as long as the past 3 Pap tests have been normal.  Some women have medical problems that increase the chance of getting cervical cancer. Talk to your caregiver about these problems. It is especially important to talk to your caregiver if a new problem develops soon after your last Pap test. In these cases, your caregiver may recommend more frequent screening and Pap tests.  The above recommendations are the same for women who have or have not gotten the vaccine for human papillomavirus (HPV).  If you had a hysterectomy for a problem that was not cancer or a condition that could lead to cancer, then you no longer need Pap tests. Even if you no longer need a Pap test, a regular exam is a good idea to make sure no other problems are  starting.  If you are between ages 65 and 70, and you have had normal Pap tests going back 10 years, you no longer need Pap tests. Even if you no longer need a Pap test, a regular exam is a good idea to make sure no other problems are starting.  If you have had past treatment for cervical cancer or a condition that could lead to cancer, you need Pap tests and screening for cancer for at least 20 years after your treatment.  If Pap tests have been discontinued, risk factors (such as a new sexual partner) need to be reassessed to determine if screening should be resumed.  The HPV test is an additional test that may be used for cervical cancer screening. The HPV test looks for the virus that can cause the cell changes on the cervix. The cells collected during the Pap test can be tested for HPV. The HPV test could be used to screen women aged 30 years and older, and should   be used in women of any age who have unclear Pap test results. After the age of 30, women should have HPV testing at the same frequency as a Pap test.  Colorectal cancer can be detected and often prevented. Most routine colorectal cancer screening begins at the age of 50 and continues through age 75. However, your caregiver may recommend screening at an earlier age if you have risk factors for colon cancer. On a yearly basis, your caregiver may provide home test kits to check for hidden blood in the stool. Use of a small camera at the end of a tube, to directly examine the colon (sigmoidoscopy or colonoscopy), can detect the earliest forms of colorectal cancer. Talk to your caregiver about this at age 50, when routine screening begins. Direct examination of the colon should be repeated every 5 to 10 years through age 75, unless early forms of pre-cancerous polyps or small growths are found.  Hepatitis C blood testing is recommended for all people born from 1945 through 1965 and any individual with known risks for hepatitis C.  Practice  safe sex. Use condoms and avoid high-risk sexual practices to reduce the spread of sexually transmitted infections (STIs). STIs include gonorrhea, chlamydia, syphilis, trichomonas, herpes, HPV, and human immunodeficiency virus (HIV). Herpes, HIV, and HPV are viral illnesses that have no cure. They can result in disability, cancer, and death. Sexually active women aged 25 and younger should be checked for chlamydia. Older women with new or multiple partners should also be tested for chlamydia. Testing for other STIs is recommended if you are sexually active and at increased risk.  Osteoporosis is a disease in which the bones lose minerals and strength with aging. This can result in serious bone fractures. The risk of osteoporosis can be identified using a bone density scan. Women ages 65 and over and women at risk for fractures or osteoporosis should discuss screening with their caregivers. Ask your caregiver whether you should take a calcium supplement or vitamin D to reduce the rate of osteoporosis.  Menopause can be associated with physical symptoms and risks. Hormone replacement therapy is available to decrease symptoms and risks. You should talk to your caregiver about whether hormone replacement therapy is right for you.  Use sunscreen with sun protection factor (SPF) of 30 or more. Apply sunscreen liberally and repeatedly throughout the day. You should seek shade when your shadow is shorter than you. Protect yourself by wearing long sleeves, pants, a wide-brimmed hat, and sunglasses year round, whenever you are outdoors.  Once a month, do a whole body skin exam, using a mirror to look at the skin on your back. Notify your caregiver of new moles, moles that have irregular borders, moles that are larger than a pencil eraser, or moles that have changed in shape or color.  Stay current with required immunizations.  Influenza. You need a dose every fall (or winter). The composition of the flu vaccine  changes each year, so being vaccinated once is not enough.  Pneumococcal polysaccharide. You need 1 to 2 doses if you smoke cigarettes or if you have certain chronic medical conditions. You need 1 dose at age 65 (or older) if you have never been vaccinated.  Tetanus, diphtheria, pertussis (Tdap, Td). Get 1 dose of Tdap vaccine if you are younger than age 65, are over 65 and have contact with an infant, are a healthcare worker, are pregnant, or simply want to be protected from whooping cough. After that, you need a Td   booster dose every 10 years. Consult your caregiver if you have not had at least 3 tetanus and diphtheria-containing shots sometime in your life or have a deep or dirty wound.  HPV. You need this vaccine if you are a woman age 26 or younger. The vaccine is given in 3 doses over 6 months.  Measles, mumps, rubella (MMR). You need at least 1 dose of MMR if you were born in 1957 or later. You may also need a second dose.  Meningococcal. If you are age 19 to 21 and a first-year college student living in a residence hall, or have one of several medical conditions, you need to get vaccinated against meningococcal disease. You may also need additional booster doses.  Zoster (shingles). If you are age 60 or older, you should get this vaccine.  Varicella (chickenpox). If you have never had chickenpox or you were vaccinated but received only 1 dose, talk to your caregiver to find out if you need this vaccine.  Hepatitis A. You need this vaccine if you have a specific risk factor for hepatitis A virus infection or you simply wish to be protected from this disease. The vaccine is usually given as 2 doses, 6 to 18 months apart.  Hepatitis B. You need this vaccine if you have a specific risk factor for hepatitis B virus infection or you simply wish to be protected from this disease. The vaccine is given in 3 doses, usually over 6 months. Preventive Services / Frequency Ages 19 to 39  Blood  pressure check.** / Every 1 to 2 years.  Lipid and cholesterol check.** / Every 5 years beginning at age 20.  Clinical breast exam.** / Every 3 years for women in their 20s and 30s.  Pap test.** / Every 2 years from ages 21 through 29. Every 3 years starting at age 30 through age 65 or 70 with a history of 3 consecutive normal Pap tests.  HPV screening.** / Every 3 years from ages 30 through ages 65 to 70 with a history of 3 consecutive normal Pap tests.  Hepatitis C blood test.** / For any individual with known risks for hepatitis C.  Skin self-exam. / Monthly.  Influenza immunization.** / Every year.  Pneumococcal polysaccharide immunization.** / 1 to 2 doses if you smoke cigarettes or if you have certain chronic medical conditions.  Tetanus, diphtheria, pertussis (Tdap, Td) immunization. / A one-time dose of Tdap vaccine. After that, you need a Td booster dose every 10 years.  HPV immunization. / 3 doses over 6 months, if you are 26 and younger.  Measles, mumps, rubella (MMR) immunization. / You need at least 1 dose of MMR if you were born in 1957 or later. You may also need a second dose.  Meningococcal immunization. / 1 dose if you are age 19 to 21 and a first-year college student living in a residence hall, or have one of several medical conditions, you need to get vaccinated against meningococcal disease. You may also need additional booster doses.  Varicella immunization.** / Consult your caregiver.  Hepatitis A immunization.** / Consult your caregiver. 2 doses, 6 to 18 months apart.  Hepatitis B immunization.** / Consult your caregiver. 3 doses usually over 6 months. Ages 40 to 64  Blood pressure check.** / Every 1 to 2 years.  Lipid and cholesterol check.** / Every 5 years beginning at age 20.  Clinical breast exam.** / Every year after age 40.  Mammogram.** / Every year beginning at age 40   and continuing for as long as you are in good health. Consult with your  caregiver.  Pap test.** / Every 3 years starting at age 30 through age 65 or 70 with a history of 3 consecutive normal Pap tests.  HPV screening.** / Every 3 years from ages 30 through ages 65 to 70 with a history of 3 consecutive normal Pap tests.  Fecal occult blood test (FOBT) of stool. / Every year beginning at age 50 and continuing until age 75. You may not need to do this test if you get a colonoscopy every 10 years.  Flexible sigmoidoscopy or colonoscopy.** / Every 5 years for a flexible sigmoidoscopy or every 10 years for a colonoscopy beginning at age 50 and continuing until age 75.  Hepatitis C blood test.** / For all people born from 1945 through 1965 and any individual with known risks for hepatitis C.  Skin self-exam. / Monthly.  Influenza immunization.** / Every year.  Pneumococcal polysaccharide immunization.** / 1 to 2 doses if you smoke cigarettes or if you have certain chronic medical conditions.  Tetanus, diphtheria, pertussis (Tdap, Td) immunization.** / A one-time dose of Tdap vaccine. After that, you need a Td booster dose every 10 years.  Measles, mumps, rubella (MMR) immunization. / You need at least 1 dose of MMR if you were born in 1957 or later. You may also need a second dose.  Varicella immunization.** / Consult your caregiver.  Meningococcal immunization.** / Consult your caregiver.  Hepatitis A immunization.** / Consult your caregiver. 2 doses, 6 to 18 months apart.  Hepatitis B immunization.** / Consult your caregiver. 3 doses, usually over 6 months. Ages 65 and over  Blood pressure check.** / Every 1 to 2 years.  Lipid and cholesterol check.** / Every 5 years beginning at age 20.  Clinical breast exam.** / Every year after age 40.  Mammogram.** / Every year beginning at age 40 and continuing for as long as you are in good health. Consult with your caregiver.  Pap test.** / Every 3 years starting at age 30 through age 65 or 70 with a 3  consecutive normal Pap tests. Testing can be stopped between 65 and 70 with 3 consecutive normal Pap tests and no abnormal Pap or HPV tests in the past 10 years.  HPV screening.** / Every 3 years from ages 30 through ages 65 or 70 with a history of 3 consecutive normal Pap tests. Testing can be stopped between 65 and 70 with 3 consecutive normal Pap tests and no abnormal Pap or HPV tests in the past 10 years.  Fecal occult blood test (FOBT) of stool. / Every year beginning at age 50 and continuing until age 75. You may not need to do this test if you get a colonoscopy every 10 years.  Flexible sigmoidoscopy or colonoscopy.** / Every 5 years for a flexible sigmoidoscopy or every 10 years for a colonoscopy beginning at age 50 and continuing until age 75.  Hepatitis C blood test.** / For all people born from 1945 through 1965 and any individual with known risks for hepatitis C.  Osteoporosis screening.** / A one-time screening for women ages 65 and over and women at risk for fractures or osteoporosis.  Skin self-exam. / Monthly.  Influenza immunization.** / Every year.  Pneumococcal polysaccharide immunization.** / 1 dose at age 65 (or older) if you have never been vaccinated.  Tetanus, diphtheria, pertussis (Tdap, Td) immunization. / A one-time dose of Tdap vaccine if you are over   65 and have contact with an infant, are a healthcare worker, or simply want to be protected from whooping cough. After that, you need a Td booster dose every 10 years.  Varicella immunization.** / Consult your caregiver.  Meningococcal immunization.** / Consult your caregiver.  Hepatitis A immunization.** / Consult your caregiver. 2 doses, 6 to 18 months apart.  Hepatitis B immunization.** / Check with your caregiver. 3 doses, usually over 6 months. ** Family history and personal history of risk and conditions may change your caregiver's recommendations. Document Released: 09/26/2001 Document Revised: 10/23/2011  Document Reviewed: 12/26/2010 [employer] Patient Information 2014 [employer].  

## 2013-05-14 HISTORY — PX: APPENDECTOMY: SHX54

## 2013-05-21 ENCOUNTER — Telehealth: Payer: Self-pay | Admitting: Family Medicine

## 2013-05-21 DIAGNOSIS — K3580 Unspecified acute appendicitis: Secondary | ICD-10-CM

## 2013-05-21 NOTE — Telephone Encounter (Signed)
Pt called, was on vacation starting 05/16/13 in [place], [place], had emergency appendectomy 05/20/13 10 am with surgeon Dr. Placido Sou at [facility] in [place], [place] [phone].   Dr. Samule Ohm would like a surgeon to follow up with her next week and wanted you to recommend / refer her to a general surgeon.  Best number to call pt [phone].  Pt is doing well, appendix was not ruptured, but did do an "open wound" incision.

## 2013-05-28 ENCOUNTER — Encounter (INDEPENDENT_AMBULATORY_CARE_PROVIDER_SITE_OTHER): Payer: Self-pay | Admitting: General Surgery

## 2013-05-28 ENCOUNTER — Encounter (INDEPENDENT_AMBULATORY_CARE_PROVIDER_SITE_OTHER): Payer: Self-pay

## 2013-05-28 ENCOUNTER — Ambulatory Visit (INDEPENDENT_AMBULATORY_CARE_PROVIDER_SITE_OTHER): Payer: BC Managed Care – PPO | Admitting: General Surgery

## 2013-05-28 VITALS — BP 126/72 | HR 68 | Resp 14 | Ht 66.0 in | Wt 175.0 lb

## 2013-05-28 DIAGNOSIS — S31109A Unspecified open wound of abdominal wall, unspecified quadrant without penetration into peritoneal cavity, initial encounter: Secondary | ICD-10-CM

## 2013-05-28 NOTE — Progress Notes (Signed)
Patient ID: Joy Yates, female   DOB: 07/25/1961, 52 y.o.   MRN: [id number]  Chief Complaint  Patient presents with  . New Evaluation    eval appy    HPI Joy Yates is a 52 y.o. female.   HPI 52 year old Caucasian female referred by Dr. Karleen Hampshire Copland for evaluation of recent emergency open appendectomy the hospital in [place] on October 7. The patient was vacationing in [place] visiting relatives. She apparently developed fever, weakness and nausea and abdominal pain. She went to the local hospital was found to have a white blood cell count 21,000 as well as acute appendicitis on CT scan. She was taken emergently to the operating room where she underwent open appendectomy for gangrenous appendix. Her surgical skin incision was left open. She returned to [place] a few days later. She denies any recent fever or chills. She denies any nausea or vomiting. She reports bowel movements. She denies any dysuria. She states that her energy and activity level are generally getting back to normal. Currently they're doing wet to dry dressings twice a day. Past Medical History  Diagnosis Date  . Hypertension   . Depression     Past Surgical History  Procedure Laterality Date  . Tonsillectomy    . Hemorrhoid surgery      X 2  . Wisdom tooth extraction  2000  . Appendectomy      Family History  Problem Relation Age of Onset  . Aneurysm Mother   . Cerebral aneurysm Mother     NOVEMVER 2009  . Hypertension Mother   . Hypertension Sister   . Hypertension Brother     Social History History  Substance Use Topics  . Smoking status: Never Smoker   . Smokeless tobacco: Never Used  . Alcohol Use: Yes     Comment: wine    No Known Allergies  Current Outpatient Prescriptions  Medication Sig Dispense Refill  . Multiple Vitamin (MULTIVITAMIN) tablet Take 1 tablet by mouth daily.        . ST JOHNS WORT PO Take by mouth.         No current facility-administered  medications for this visit.    Review of Systems Review of Systems  Constitutional: Negative for fever, activity change, appetite change and unexpected weight change.  HENT: Negative for nosebleeds and trouble swallowing.   Eyes: Negative for photophobia and visual disturbance.  Respiratory: Negative for chest tightness and shortness of breath.   Cardiovascular: Negative for chest pain and leg swelling.       Denies CP, SOB, orthopnea, PND, DOE  Genitourinary: Negative for dysuria and difficulty urinating.  Musculoskeletal: Negative for arthralgias.  Skin: Negative for pallor and rash.  Neurological: Negative for dizziness, seizures, facial asymmetry and numbness.       Denies TIA and amaurosis fugax   Hematological: Negative for adenopathy. Does not bruise/bleed easily.  Psychiatric/Behavioral: Negative for behavioral problems and agitation.    Blood pressure 126/72, pulse 68, resp. rate 14, height 5\' 6"  (1.676 m), weight 175 lb (79.379 kg).  Physical Exam Physical Exam  Vitals reviewed. Constitutional: She is oriented to person, place, and time. She appears well-developed and well-nourished. No distress.  HENT:  Head: Normocephalic and atraumatic.  Right Ear: External ear normal.  Left Ear: External ear normal.  Eyes: Conjunctivae are normal. No scleral icterus.  Neck: Normal range of motion. No tracheal deviation present.  Cardiovascular: Normal rate and normal heart sounds.   Pulmonary/Chest:  Effort normal and breath sounds normal. No stridor. No respiratory distress. She has no wheezes.  Abdominal: Soft. She exhibits no distension. There is no tenderness. There is no rebound and no guarding.    Open RLQ incision - not deep; skin edges about 3mm apart. Some generalized skin bruising and mild swelling. No cellulitis, fluctance or induration. No hernia  Musculoskeletal: She exhibits no edema.  Neurological: She is alert and oriented to person, place, and time. She exhibits  normal muscle tone.  Skin: Skin is warm and dry. No rash noted. She is not diaphoretic. No erythema.  Psychiatric: She has a normal mood and affect. Her behavior is normal. Judgment and thought content normal.      Data Reviewed Dr Samule Ohm op note and H&P  Assessment    S/p Open appendectomy for gangrenous appendicitis Open RLQ abdominal incision     Plan    Overall thinks she is doing quite well. Her incision looks good. There is no sign of infection. I believe the generalized swelling will continue to improve with time. I do not believe she needs to do wet-to-dry dressings since the incision is not that wide. I told her she could just come here with a dry gauze and change it at least once a day. She was told she could shower in the morning to get wet with soap and water. She was reminded she should not do any heavy lifting for another 5 weeks. All of her other questions were asked and answered. Followup as needed  Mary Sella. Andrey Campanile, MD, FACS General, Bariatric, & Minimally Invasive Surgery [facility], [place]         [facility] M 05/28/2013, 10:01 AM

## 2013-05-28 NOTE — Patient Instructions (Signed)
Avoid heavy lifting for 5 weeks Do dressing change at lease once a day Cover wound with dry gauze, use saline to loosen gauze from wound when change dressing

## 2013-08-06 ENCOUNTER — Other Ambulatory Visit: Payer: Self-pay | Admitting: Family Medicine

## 2013-08-08 ENCOUNTER — Other Ambulatory Visit: Payer: Self-pay | Admitting: Family Medicine

## 2013-08-08 NOTE — Telephone Encounter (Signed)
Pt requesting medication refill. Not listed on current med list. Last ov 09/25/12 with no future appts scheduled. pls advise.

## 2013-11-27 ENCOUNTER — Ambulatory Visit: Payer: BC Managed Care – PPO | Admitting: Family Medicine

## 2013-12-05 ENCOUNTER — Encounter: Payer: Self-pay | Admitting: Gastroenterology

## 2013-12-05 ENCOUNTER — Ambulatory Visit (INDEPENDENT_AMBULATORY_CARE_PROVIDER_SITE_OTHER): Payer: BC Managed Care – PPO | Admitting: Family Medicine

## 2013-12-05 ENCOUNTER — Encounter: Payer: Self-pay | Admitting: Family Medicine

## 2013-12-05 ENCOUNTER — Telehealth: Payer: Self-pay | Admitting: Family Medicine

## 2013-12-05 VITALS — BP 124/90 | HR 71 | Temp 98.1°F | Ht 65.67 in | Wt 184.0 lb

## 2013-12-05 DIAGNOSIS — I10 Essential (primary) hypertension: Secondary | ICD-10-CM

## 2013-12-05 DIAGNOSIS — Z13220 Encounter for screening for lipoid disorders: Secondary | ICD-10-CM

## 2013-12-05 DIAGNOSIS — Z1211 Encounter for screening for malignant neoplasm of colon: Secondary | ICD-10-CM

## 2013-12-05 DIAGNOSIS — R42 Dizziness and giddiness: Secondary | ICD-10-CM | POA: Insufficient documentation

## 2013-12-05 LAB — TSH: TSH: 0.97 u[IU]/mL (ref 0.35–5.50)

## 2013-12-05 LAB — COMPREHENSIVE METABOLIC PANEL
ALT: 65 U/L — ABNORMAL HIGH (ref 0–35)
AST: 39 U/L — ABNORMAL HIGH (ref 0–37)
Albumin: 4.5 g/dL (ref 3.5–5.2)
Alkaline Phosphatase: 80 U/L (ref 39–117)
BUN: 14 mg/dL (ref 6–23)
CO2: 24 mEq/L (ref 19–32)
Calcium: 9.8 mg/dL (ref 8.4–10.5)
Chloride: 102 mEq/L (ref 96–112)
Creatinine, Ser: 0.7 mg/dL (ref 0.4–1.2)
GFR: 90.63 mL/min (ref >60.00)
Glucose, Bld: 96 mg/dL (ref 70–99)
Potassium: 4.1 meq/L (ref 3.5–5.1)
Sodium: 138 mEq/L (ref 135–145)
Total Bilirubin: 1 mg/dL (ref 0.3–1.2)
Total Protein: 7.2 g/dL (ref 6.0–8.3)

## 2013-12-05 LAB — CBC WITH DIFFERENTIAL/PLATELET
Basophils Absolute: 0 10*3/uL (ref 0.0–0.1)
Basophils Relative: 0.1 % (ref 0.0–3.0)
Eosinophils Absolute: 0.2 10*3/uL (ref 0.0–0.7)
Eosinophils Relative: 3.5 % (ref 0.0–5.0)
HCT: 47.5 % — ABNORMAL HIGH (ref 36.0–46.0)
Hemoglobin: 16.3 g/dL — ABNORMAL HIGH (ref 12.0–15.0)
Lymphocytes Relative: 23.8 % (ref 12.0–46.0)
Lymphs Abs: 1.6 10*3/uL (ref 0.7–4.0)
MCHC: 34.3 g/dL (ref 30.0–36.0)
MCV: 87.1 fl (ref 78.0–100.0)
Monocytes Absolute: 0.5 10*3/uL (ref 0.1–1.0)
Monocytes Relative: 8 % (ref 3.0–12.0)
Neutro Abs: 4.4 10*3/uL (ref 1.4–7.7)
Neutrophils Relative %: 64.6 % (ref 43.0–77.0)
Platelets: 295 10*3/uL (ref 150.0–400.0)
RBC: 5.46 Mil/uL — ABNORMAL HIGH (ref 3.87–5.11)
RDW: 12.8 % (ref 11.5–14.6)
WBC: 6.8 10*3/uL (ref 4.5–10.5)

## 2013-12-05 LAB — LIPID PANEL
Cholesterol: 205 mg/dL — ABNORMAL HIGH (ref 0–200)
HDL: 40.8 mg/dL (ref >39.00)
LDL Cholesterol: 133 mg/dL — ABNORMAL HIGH (ref 0–99)
Total CHOL/HDL Ratio: 5
Triglycerides: 156 mg/dL — ABNORMAL HIGH (ref 0.0–149.0)
VLDL: 31.2 mg/dL (ref 0.0–40.0)

## 2013-12-05 LAB — VITAMIN B12: Vitamin B-12: 647 pg/mL (ref 211–911)

## 2013-12-05 MED ORDER — LISINOPRIL 2.5 MG PO TABS: 2.5000 mg | ORAL_TABLET | Freq: Every day | ORAL | Status: DC

## 2013-12-05 NOTE — Progress Notes (Signed)
Pre visit review using our clinic review tool, if applicable. No additional management support is needed unless otherwise documented below in the visit note. 

## 2013-12-05 NOTE — Assessment & Plan Note (Addendum)
Eval with labs, nml neuro exam today, no cerebellar exam.  Does ot sound like vertigo.  If continuing consider imaging given pt concern and  family history of aneurysm.

## 2013-12-05 NOTE — Assessment & Plan Note (Signed)
Inadequate control, start low dose lisininopril daily. Follow BP at home.

## 2013-12-05 NOTE — Telephone Encounter (Signed)
Relevant patient education assigned to patient using Emmi. ° °

## 2013-12-05 NOTE — Patient Instructions (Addendum)
Restart lisinopril low dose.  Check BP every few days, goal < 140/90. Continue working on exercise , weight loss and healthy eating.  Stop at lab and front desk on way out.

## 2013-12-05 NOTE — Progress Notes (Signed)
   Subjective:    Patient ID: Joy Yates, female    DOB: 04-18-1961, 53 y.o.   MRN: [id number]  HPI The patient is here for chronic health maintenance and preventative care.    She is a pt of Dr. Lillie Fragmin but would like to have her CPX done today.  Hypertension: moderate controlled on lisinopril as needed. When she took everyday she had SE of disorientation. BP Readings from Last 3 Encounters:  12/05/13 124/90  05/28/13 126/72  04/24/13 119/90  Using medication without problems or lightheadedness:  Daily in last year. No vertigo, no associated with movement Chest pain with exertion:None Edema:None Short of breath:None Average home BPs: not checking Other issues:  She has recently started exercise: power walking.  Depression, well controlled on st john's wort   family history mother with aneurysm,. CAD and CVA  Review of Systems  Constitutional: Negative for fever and fatigue.  HENT: Negative for ear pain.   Eyes: Negative for pain.  Respiratory: Negative for chest tightness and shortness of breath.   Cardiovascular: Negative for chest pain, palpitations and leg swelling.  Gastrointestinal: Negative for abdominal pain.  Genitourinary: Negative for dysuria.  Neurological: Positive for dizziness. Negative for tremors, seizures, syncope, facial asymmetry, weakness, numbness and headaches.       Objective:   Physical Exam  Constitutional: Vital signs are normal. She appears well-developed and well-nourished. She is cooperative.  Non-toxic appearance. She does not appear ill. No distress.  HENT:  Head: Normocephalic.  Right Ear: Hearing, tympanic membrane, external ear and ear canal normal.  Left Ear: Hearing, tympanic membrane, external ear and ear canal normal.  Nose: Nose normal.  Eyes: Conjunctivae, EOM and lids are normal. Pupils are equal, round, and reactive to light. Lids are everted and swept, no foreign bodies found.  Neck: Trachea normal and normal range of  motion. Neck supple. Carotid bruit is not present. No mass and no thyromegaly present.  Cardiovascular: Normal rate, regular rhythm, S1 normal, S2 normal, normal heart sounds and intact distal pulses.  Exam reveals no gallop.   No murmur heard. Pulmonary/Chest: Effort normal and breath sounds normal. No respiratory distress. She has no wheezes. She has no rhonchi. She has no rales.  Abdominal: Soft. Normal appearance and bowel sounds are normal. She exhibits no distension, no fluid wave, no abdominal bruit and no mass. There is no hepatosplenomegaly. There is no tenderness. There is no rebound, no guarding and no CVA tenderness. No hernia.  Lymphadenopathy:    She has no cervical adenopathy.    She has no axillary adenopathy.  Neurological: She is alert. She has normal strength. No cranial nerve deficit or sensory deficit.  Skin: Skin is warm, dry and intact. No rash noted.  Psychiatric: Her speech is normal and behavior is normal. Judgment normal. Her mood appears not anxious. Cognition and memory are normal. She does not exhibit a depressed mood.          Assessment & Plan:  The patient's preventative maintenance and recommended screening tests for an annual wellness exam were reviewed in full today. Brought up to date unless services declined.  Counselled on the importance of diet, exercise, and its role in overall health and mortality. The patient's FH and SH was reviewed, including their home life, tobacco status, and drug and alcohol status.   Vaccines:uptodate with Td, due next year Colon: Plan colonoscopy. PAP/GYN: nml pap in 2014 with GYN  Mammo: per GYN No STD concern nonsmoker

## 2013-12-08 ENCOUNTER — Encounter: Payer: Self-pay | Admitting: *Deleted

## 2013-12-08 ENCOUNTER — Other Ambulatory Visit: Payer: Self-pay | Admitting: Family Medicine

## 2013-12-08 DIAGNOSIS — R748 Abnormal levels of other serum enzymes: Secondary | ICD-10-CM

## 2013-12-12 ENCOUNTER — Other Ambulatory Visit (INDEPENDENT_AMBULATORY_CARE_PROVIDER_SITE_OTHER): Payer: BC Managed Care – PPO

## 2013-12-12 DIAGNOSIS — R748 Abnormal levels of other serum enzymes: Secondary | ICD-10-CM

## 2013-12-12 LAB — HEPATIC FUNCTION PANEL
ALT: 47 U/L — ABNORMAL HIGH (ref 0–35)
AST: 27 U/L (ref 0–37)
Albumin: 4.2 g/dL (ref 3.5–5.2)
Alkaline Phosphatase: 78 U/L (ref 39–117)
Bilirubin, Direct: 0.1 mg/dL (ref 0.0–0.3)
Indirect Bilirubin: 0.3 mg/dL (ref 0.2–1.2)
Total Bilirubin: 0.4 mg/dL (ref 0.2–1.2)
Total Protein: 6.2 g/dL (ref 6.0–8.3)

## 2013-12-13 LAB — HEPATITIS PANEL, ACUTE
HCV Ab: NEGATIVE
Hep A IgM: NONREACTIVE
Hep B C IgM: NONREACTIVE
Hepatitis B Surface Ag: NEGATIVE

## 2014-01-07 ENCOUNTER — Encounter: Payer: BC Managed Care – PPO | Admitting: Family Medicine

## 2014-01-23 ENCOUNTER — Ambulatory Visit
Admission: RE | Admit: 2014-01-23 | Discharge: 2014-01-23 | Disposition: A | Discharge status: Home / Self Care | Payer: BC Managed Care – PPO | Source: Ambulatory Visit | Attending: Obstetrics and Gynecology | Admitting: Obstetrics and Gynecology

## 2014-01-23 ENCOUNTER — Ambulatory Visit (AMBULATORY_SURGERY_CENTER): Payer: BC Managed Care – PPO

## 2014-01-23 ENCOUNTER — Other Ambulatory Visit: Payer: Self-pay | Admitting: Obstetrics and Gynecology

## 2014-01-23 VITALS — Ht 66.0 in | Wt 186.0 lb

## 2014-01-23 DIAGNOSIS — Z1231 Encounter for screening mammogram for malignant neoplasm of breast: Secondary | ICD-10-CM

## 2014-01-23 DIAGNOSIS — Z1211 Encounter for screening for malignant neoplasm of colon: Secondary | ICD-10-CM

## 2014-01-23 DIAGNOSIS — Z01419 Encounter for gynecological examination (general) (routine) without abnormal findings: Secondary | ICD-10-CM

## 2014-01-23 MED ORDER — MOVIPREP 100 G PO SOLR: 1.0000 | Freq: Once | ORAL | Status: DC

## 2014-01-23 NOTE — Progress Notes (Signed)
No allergies to eggs or soy No past problems with anesthesia No diet/weight loss meds No home oxygen  Has email  Emmi instructions given for colonoscopy 

## 2014-02-06 ENCOUNTER — Encounter: Payer: Self-pay | Admitting: Gastroenterology

## 2014-02-06 ENCOUNTER — Ambulatory Visit (AMBULATORY_SURGERY_CENTER): Payer: BC Managed Care – PPO | Admitting: Gastroenterology

## 2014-02-06 VITALS — BP 136/74 | HR 74 | Temp 97.4°F | Resp 39 | Ht 66.0 in | Wt 186.0 lb

## 2014-02-06 DIAGNOSIS — D126 Benign neoplasm of colon, unspecified: Secondary | ICD-10-CM

## 2014-02-06 DIAGNOSIS — Z1211 Encounter for screening for malignant neoplasm of colon: Secondary | ICD-10-CM

## 2014-02-06 MED ORDER — SODIUM CHLORIDE 0.9 % IV SOLN: 500.0000 mL | INTRAVENOUS | Status: DC

## 2014-02-06 NOTE — Progress Notes (Signed)
Called to room to assist during endoscopic procedure.  Patient ID and intended procedure confirmed with present staff. Received instructions for my participation in the procedure from the performing physician.  

## 2014-02-06 NOTE — Op Note (Signed)
[place]  [address] [place], [address]   COLONOSCOPY PROCEDURE REPORT PATIENT: Joy Yates, Joy Yates  MR#: [id number] BIRTHDATE: 01/05/1961 , 52  yrs. old GENDER: Female ENDOSCOPIST: Ladene Artist, MD, [facility] REFERRED AY:[id number] Celedonio Savage, M.D. PROCEDURE DATE:  02/06/2014 PROCEDURE:   Colonoscopy with biopsy and snare polypectomy First Screening Colonoscopy - Avg.  risk and is 50 yrs.  old or older Yes.  Prior Negative Screening - Now for repeat screening. N/A  History of Adenoma - Now for follow-up colonoscopy & has been > or = to 3 yrs.  N/A  Polyps Removed Today? Yes. ASA CLASS:   Class II INDICATIONS:average risk screening. MEDICATIONS: MAC sedation, administered by CRNA and propofol (Diprivan) 280mg  IV DESCRIPTION OF PROCEDURE:   After the risks benefits and alternatives of the procedure were thoroughly explained, informed consent was obtained.  A digital rectal exam revealed no abnormalities of the rectum.   The LB [id number]  endoscope was introduced through the anus and advanced to the cecum, which was identified by both the appendix and ileocecal valve. No adverse events experienced.   The quality of the prep was good, using MoviPrep  The instrument was then slowly withdrawn as the colon was fully examined.  COLON FINDINGS: Two sessile polyps measuring 7-8 mm in size were found in the ascending colon.  A polypectomy was performed using snare cautery.  The resection was complete and the polyp tissue was completely retrieved.   Two sessile polyps ranging between 3-38mm in size were found in the transverse colon.  A polypectomy was performed with a cold snare and with cold forceps.  The resection was complete and the polyp tissue was completely retrieved.   The colon was otherwise normal.  There was no diverticulosis, inflammation, polyps or cancers unless previously stated. Retroflexed views revealed no abnormalities. The time to cecum=2 minutes  32 seconds.  Withdrawal time=11 minutes 05 seconds.  The scope was withdrawn and the procedure completed. COMPLICATIONS: There were no complications.  ENDOSCOPIC IMPRESSION: 1.   Two sessile polyps measuring 7-8 mm in the ascending colon; polypectomy performed using snare cautery 2.   Two sessile polyps ranging between 3-16mm in the transverse colon; polypectomy performed with a cold snare and with cold forceps 3.   The colon was otherwise normal  RECOMMENDATIONS: 1.  Hold aspirin, aspirin products, and anti-inflammatory medication for 2 weeks. 2.  Await pathology results 3.  Repeat colonoscopy in 3 years if 3 or 4 polyps adenomatous; 5 years if 1-2 polyps adenomatous; otherwise 10 years  eSigned:  Ladene Artist, MD, [facility] 02/06/2014 2:47 PM

## 2014-02-06 NOTE — Patient Instructions (Signed)
YOU HAD AN ENDOSCOPIC PROCEDURE TODAY AT [facility]: Refer to the procedure report that was given to you for any specific questions about what was found during the examination.  If the procedure report does not answer your questions, please call your gastroenterologist to clarify.  If you requested that your care partner not be given the details of your procedure findings, then the procedure report has been included in a sealed envelope for you to review at your convenience later.  YOU SHOULD EXPECT: Some feelings of bloating in the abdomen. Passage of more gas than usual.  Walking can help get rid of the air that was put into your GI tract during the procedure and reduce the bloating. If you had a lower endoscopy (such as a colonoscopy or flexible sigmoidoscopy) you may notice spotting of blood in your stool or on the toilet paper. If you underwent a bowel prep for your procedure, then you may not have a normal bowel movement for a few days.  DIET: Your first meal following the procedure should be a light meal and then it is ok to progress to your normal diet.  A half-sandwich or bowl of soup is an example of a good first meal.  Heavy or fried foods are harder to digest and may make you feel nauseous or bloated.  Likewise meals heavy in dairy and vegetables can cause extra gas to form and this can also increase the bloating.  Drink plenty of fluids but you should avoid alcoholic beverages for 24 hours.  ACTIVITY: Your care partner should take you home directly after the procedure.  You should plan to take it easy, moving slowly for the rest of the day.  You can resume normal activity the day after the procedure however you should NOT DRIVE or use heavy machinery for 24 hours (because of the sedation medicines used during the test).    SYMPTOMS TO REPORT IMMEDIATELY: A gastroenterologist can be reached at any hour.  During normal business hours, 8:30 AM to 5:00 PM Monday through Friday,  call [phone].  After hours and on weekends, please call the GI answering service at [phone] who will take a message and have the physician on call contact you.   Following lower endoscopy (colonoscopy or flexible sigmoidoscopy):  Excessive amounts of blood in the stool  Significant tenderness or worsening of abdominal pains  Swelling of the abdomen that is new, acute  Fever of 100F or higher  FOLLOW UP: If any biopsies were taken you will be contacted by phone or by letter within the next 1-3 weeks.  Call your gastroenterologist if you have not heard about the biopsies in 3 weeks.  Our staff will call the home number listed on your records the next business day following your procedure to check on you and address any questions or concerns that you may have at that time regarding the information given to you following your procedure. This is a courtesy call and so if there is no answer at the home number and we have not heard from you through the emergency physician on call, we will assume that you have returned to your regular daily activities without incident.  HOLD ASPIRIN, NSAIDS, ALEVE, IBUPROFEN FOR TW0 WEEKS.  READ ALL HANDOUTS GIVEN TO YOU BY YOUR RECOVERY ROOM NURSE.  SIGNATURES/CONFIDENTIALITY: You and/or your care partner have signed paperwork which will be entered into your electronic medical record.  These signatures attest to the fact that that the  information above on your After Visit Summary has been reviewed and is understood.  Full responsibility of the confidentiality of this discharge information lies with you and/or your care-partner.

## 2014-02-06 NOTE — Progress Notes (Signed)
A/ox3, pleased with MAC, report to RN 

## 2014-02-09 ENCOUNTER — Telehealth: Payer: Self-pay | Admitting: *Deleted

## 2014-02-09 NOTE — Telephone Encounter (Signed)
  Follow up Call-  Call back number 02/06/2014  Post procedure Call Back phone  # [phone]  Permission to leave phone message Yes     No answer, left message.

## 2014-02-15 ENCOUNTER — Encounter: Payer: Self-pay | Admitting: Gastroenterology

## 2014-02-19 ENCOUNTER — Telehealth: Payer: Self-pay | Admitting: Gastroenterology

## 2014-02-19 NOTE — Telephone Encounter (Signed)
Left message for patient to call back  

## 2014-02-19 NOTE — Telephone Encounter (Signed)
I have reviewed the results with the patient and all questions answered.  She will call back for any additional questions or concerns

## 2014-03-23 ENCOUNTER — Other Ambulatory Visit: Payer: Self-pay | Admitting: Family Medicine

## 2014-03-23 DIAGNOSIS — R7989 Other specified abnormal findings of blood chemistry: Secondary | ICD-10-CM

## 2014-03-27 ENCOUNTER — Other Ambulatory Visit (INDEPENDENT_AMBULATORY_CARE_PROVIDER_SITE_OTHER): Payer: BC Managed Care – PPO

## 2014-03-27 DIAGNOSIS — I10 Essential (primary) hypertension: Secondary | ICD-10-CM

## 2014-03-27 DIAGNOSIS — R7989 Other specified abnormal findings of blood chemistry: Secondary | ICD-10-CM

## 2014-03-27 LAB — HEPATIC FUNCTION PANEL
ALT: 42 U/L — ABNORMAL HIGH (ref 0–35)
AST: 30 U/L (ref 0–37)
Albumin: 4.3 g/dL (ref 3.5–5.2)
Alkaline Phosphatase: 74 U/L (ref 39–117)
Bilirubin, Direct: 0.1 mg/dL (ref 0.0–0.3)
Total Bilirubin: 1.3 mg/dL — ABNORMAL HIGH (ref 0.2–1.2)
Total Protein: 7 g/dL (ref 6.0–8.3)

## 2014-06-04 ENCOUNTER — Ambulatory Visit (INDEPENDENT_AMBULATORY_CARE_PROVIDER_SITE_OTHER): Payer: BC Managed Care – PPO

## 2014-06-04 DIAGNOSIS — Z23 Encounter for immunization: Secondary | ICD-10-CM

## 2014-08-27 ENCOUNTER — Encounter: Payer: Self-pay | Admitting: Family Medicine

## 2014-08-27 ENCOUNTER — Ambulatory Visit (INDEPENDENT_AMBULATORY_CARE_PROVIDER_SITE_OTHER): Payer: BLUE CROSS/BLUE SHIELD | Admitting: Family Medicine

## 2014-08-27 VITALS — BP 138/90 | HR 80 | Temp 98.3°F | Ht 65.67 in | Wt 181.2 lb

## 2014-08-27 DIAGNOSIS — J209 Acute bronchitis, unspecified: Secondary | ICD-10-CM | POA: Insufficient documentation

## 2014-08-27 MED ORDER — GUAIFENESIN-CODEINE 100-10 MG/5ML PO SYRP: 5.0000 mL | ORAL_SOLUTION | Freq: Every evening | ORAL | Status: DC | PRN

## 2014-08-27 MED ORDER — AZITHROMYCIN 250 MG PO TABS: ORAL_TABLET | ORAL | Status: DC

## 2014-08-27 NOTE — Progress Notes (Signed)
   Subjective:    Patient ID: Joy Yates, female    DOB: 11-03-1960, 54 y.o.   MRN: [id number]  Cough This is a new problem. The current episode started in the past 7 days. The problem has been gradually worsening. The problem occurs constantly. The cough is productive of sputum. Associated symptoms include a fever, nasal congestion, shortness of breath and wheezing. Pertinent negatives include no ear congestion, ear pain, myalgias, sore throat or sweats. Associated symptoms comments: Sinus pressure, nasal congestion  low grade temp. The symptoms are aggravated by lying down (cough kee(ing her up at night). Risk factors: no smoker. Treatments tried: robitussin, vit C. The treatment provided mild relief. Her past medical history is significant for environmental allergies. There is no history of asthma, bronchitis, COPD, emphysema or pneumonia.  Fever  Associated symptoms include coughing and wheezing. Pertinent negatives include no ear pain or sore throat.   BP Readings from Last 3 Encounters:  08/27/14 138/90  02/06/14 136/74  12/05/13 124/90      Review of Systems  Constitutional: Positive for fever.  HENT: Negative for ear pain and sore throat.   Respiratory: Positive for cough, shortness of breath and wheezing.   Musculoskeletal: Negative for myalgias.  Allergic/Immunologic: Positive for environmental allergies.       Objective:   Physical Exam  Constitutional: Vital signs are normal. She appears well-developed and well-nourished. She is cooperative.  Non-toxic appearance. She does not appear ill. No distress.  HENT:  Head: Normocephalic.  Right Ear: Hearing, tympanic membrane, external ear and ear canal normal. Tympanic membrane is not erythematous, not retracted and not bulging.  Left Ear: Hearing, tympanic membrane, external ear and ear canal normal. Tympanic membrane is not erythematous, not retracted and not bulging.  Nose: Mucosal edema and rhinorrhea present. Right  sinus exhibits no maxillary sinus tenderness and no frontal sinus tenderness. Left sinus exhibits no maxillary sinus tenderness and no frontal sinus tenderness.  Mouth/Throat: Uvula is midline, oropharynx is clear and moist and mucous membranes are normal.  Eyes: Conjunctivae, EOM and lids are normal. Pupils are equal, round, and reactive to light. Lids are everted and swept, no foreign bodies found.  Neck: Trachea normal and normal range of motion. Neck supple. Carotid bruit is not present. No thyroid mass and no thyromegaly present.  Cardiovascular: Normal rate, regular rhythm, S1 normal, S2 normal, normal heart sounds, intact distal pulses and normal pulses.  Exam reveals no gallop and no friction rub.   No murmur heard. Pulmonary/Chest: Effort normal and breath sounds normal. No tachypnea. No respiratory distress. She has no decreased breath sounds. She has no wheezes. She has no rhonchi. She has no rales.  Neurological: She is alert.  Skin: Skin is warm, dry and intact. No rash noted.  Psychiatric: Her speech is normal and behavior is normal. Judgment normal. Her mood appears not anxious. Cognition and memory are normal. She does not exhibit a depressed mood.          Assessment & Plan:

## 2014-08-27 NOTE — Progress Notes (Signed)
Pre visit review using our clinic review tool, if applicable. No additional management support is needed unless otherwise documented below in the visit note. 

## 2014-08-27 NOTE — Patient Instructions (Signed)
Start antibiotics, complete. Rest, fluids. Cough suppressant at night.

## 2014-12-21 ENCOUNTER — Other Ambulatory Visit: Payer: Self-pay

## 2014-12-21 DIAGNOSIS — Z1231 Encounter for screening mammogram for malignant neoplasm of breast: Secondary | ICD-10-CM

## 2015-02-08 ENCOUNTER — Other Ambulatory Visit: Payer: Self-pay | Admitting: Family Medicine

## 2015-03-07 ENCOUNTER — Other Ambulatory Visit: Payer: Self-pay | Admitting: Family Medicine

## 2015-03-15 ENCOUNTER — Other Ambulatory Visit: Payer: Self-pay | Admitting: Family Medicine

## 2015-04-09 ENCOUNTER — Other Ambulatory Visit: Payer: Self-pay | Admitting: Family Medicine

## 2015-04-09 ENCOUNTER — Ambulatory Visit
Admission: RE | Admit: 2015-04-09 | Discharge: 2015-04-09 | Disposition: A | Discharge status: Home / Self Care | Payer: BLUE CROSS/BLUE SHIELD | Source: Ambulatory Visit

## 2015-04-09 DIAGNOSIS — Z1231 Encounter for screening mammogram for malignant neoplasm of breast: Secondary | ICD-10-CM

## 2015-04-16 ENCOUNTER — Ambulatory Visit (INDEPENDENT_AMBULATORY_CARE_PROVIDER_SITE_OTHER): Payer: BLUE CROSS/BLUE SHIELD | Admitting: Family Medicine

## 2015-04-16 ENCOUNTER — Encounter: Payer: Self-pay | Admitting: Family Medicine

## 2015-04-16 VITALS — BP 128/90 | HR 66 | Temp 98.0°F | Ht 65.5 in | Wt 180.5 lb

## 2015-04-16 DIAGNOSIS — R42 Dizziness and giddiness: Secondary | ICD-10-CM | POA: Diagnosis not present

## 2015-04-16 DIAGNOSIS — Z8249 Family history of ischemic heart disease and other diseases of the circulatory system: Secondary | ICD-10-CM

## 2015-04-16 DIAGNOSIS — Z0000 Encounter for general adult medical examination without abnormal findings: Secondary | ICD-10-CM

## 2015-04-16 DIAGNOSIS — R51 Headache: Secondary | ICD-10-CM

## 2015-04-16 DIAGNOSIS — Z23 Encounter for immunization: Secondary | ICD-10-CM | POA: Diagnosis not present

## 2015-04-16 DIAGNOSIS — Z8489 Family history of other specified conditions: Secondary | ICD-10-CM

## 2015-04-16 DIAGNOSIS — Z13220 Encounter for screening for lipoid disorders: Secondary | ICD-10-CM

## 2015-04-16 DIAGNOSIS — I10 Essential (primary) hypertension: Secondary | ICD-10-CM | POA: Diagnosis not present

## 2015-04-16 DIAGNOSIS — G8929 Other chronic pain: Secondary | ICD-10-CM | POA: Insufficient documentation

## 2015-04-16 DIAGNOSIS — R519 Headache, unspecified: Secondary | ICD-10-CM | POA: Insufficient documentation

## 2015-04-16 LAB — COMPREHENSIVE METABOLIC PANEL
ALT: 47 U/L — ABNORMAL HIGH (ref 6–29)
AST: 26 U/L (ref 10–35)
Albumin: 4.5 g/dL (ref 3.6–5.1)
Alkaline Phosphatase: 72 U/L (ref 33–130)
BUN: 9 mg/dL (ref 7–25)
CO2: 29 mmol/L (ref 20–31)
Calcium: 9.6 mg/dL (ref 8.6–10.4)
Chloride: 102 mmol/L (ref 98–110)
Creat: 0.65 mg/dL (ref 0.50–1.05)
Glucose, Bld: 96 mg/dL (ref 65–99)
Potassium: 3.9 mmol/L (ref 3.5–5.3)
Sodium: 144 mmol/L (ref 135–146)
Total Bilirubin: 0.7 mg/dL (ref 0.2–1.2)
Total Protein: 6.8 g/dL (ref 6.1–8.1)

## 2015-04-16 LAB — CBC WITH DIFFERENTIAL/PLATELET
Basophils Absolute: 0 10*3/uL (ref 0.0–0.1)
Basophils Relative: 0 % (ref 0–1)
Eosinophils Absolute: 0.4 10*3/uL (ref 0.0–0.7)
Eosinophils Relative: 5 % (ref 0–5)
HCT: 46.3 % — ABNORMAL HIGH (ref 36.0–46.0)
Hemoglobin: 16.4 g/dL — ABNORMAL HIGH (ref 12.0–15.0)
Lymphocytes Relative: 24 % (ref 12–46)
Lymphs Abs: 1.7 10*3/uL (ref 0.7–4.0)
MCH: 30.1 pg (ref 26.0–34.0)
MCHC: 35.4 g/dL (ref 30.0–36.0)
MCV: 85 fL (ref 78.0–100.0)
MPV: 9.2 fL (ref 8.6–12.4)
Monocytes Absolute: 0.8 10*3/uL (ref 0.1–1.0)
Monocytes Relative: 11 % (ref 3–12)
Neutro Abs: 4.2 10*3/uL (ref 1.7–7.7)
Neutrophils Relative %: 60 % (ref 43–77)
Platelets: 289 10*3/uL (ref 150–400)
RBC: 5.45 MIL/uL — ABNORMAL HIGH (ref 3.87–5.11)
RDW: 13.2 % (ref 11.5–15.5)
WBC: 7 10*3/uL (ref 4.0–10.5)

## 2015-04-16 LAB — LIPID PANEL
Cholesterol: 228 mg/dL — ABNORMAL HIGH (ref 125–200)
HDL: 44 mg/dL — ABNORMAL LOW (ref >=46)
LDL Cholesterol: 153 mg/dL — ABNORMAL HIGH (ref <130)
Total CHOL/HDL Ratio: 5.2 {ratio} — ABNORMAL HIGH (ref <=5.0)
Triglycerides: 153 mg/dL — ABNORMAL HIGH (ref <150)
VLDL: 31 mg/dL — ABNORMAL HIGH (ref <30)

## 2015-04-16 NOTE — Patient Instructions (Addendum)
Keep working on healthy eating and regular exercise to be able to continue weight loss. Stop at lab on way out.  Stop at front desk to set up MRI brain.

## 2015-04-16 NOTE — Assessment & Plan Note (Signed)
Well controlled. Continue current medication. Encouraged exercise, weight loss, healthy eating habits.  

## 2015-04-16 NOTE — Assessment & Plan Note (Signed)
Lab eval negative last year. Continued symptoms.  With family history and frequent headaches, pt wishes to proceed with further imaging.

## 2015-04-16 NOTE — Progress Notes (Signed)
Subjective:    Patient ID: Joy Yates, female    DOB: October 01, 1960, 54 y.o.   MRN: [id number]  HPI The patient is here for annual wellness exam and preventative care.   Pt of Dr. Lillie Fragmin  She has been feeling lightheaded frequently for years. NO vertigo, " woozy"  Feels off balance, no falls.  She has headaches, ( 6-7 on pain scale of 10) off and 2-3 times a week. No change over time.   Occ associated with nausea. No change with movement or moving head. Mother's sister born with tumor, no clear what type. Had dizziness at later age that lead to diagnosis. Other with aneursym. She is requesting MRI brain.  Nml B12, nml THS, nml cbc  Hypertension:  Stable on lisinopril 2.5 mg daily. BP Readings from Last 3 Encounters:  04/16/15 128/90  08/27/14 138/90  02/06/14 136/74   Using medication without problems or lightheadedness: see aboveChest pain with exertion: Edema: None Short of breath:None Average home BPs: 120/80-90s Other issues:  Mildly elevation in LFTS last year.. Was taken ibuprofen.  ETOH: 1 glass of wine 3 days a week.  Due for CMET and lipids screening.  Last year Hg 16, ? why  Exercise: walking daily 30 min Diet: healthy, salads, avoids fried foods, eats whole wheat.   Wt Readings from Last 3 Encounters:  04/16/15 180 lb 8 oz (81.874 kg)  08/27/14 181 lb 4 oz (82.214 kg)  02/06/14 186 lb (84.369 kg)   Body mass index is 29.57 kg/(m^2).   Review of Systems     Objective:   Physical Exam  Constitutional: She is oriented to person, place, and time. Vital signs are normal. She appears well-developed and well-nourished. She is cooperative.  Non-toxic appearance. She does not appear ill. No distress.  HENT:  Head: Normocephalic.  Right Ear: Hearing, tympanic membrane, external ear and ear canal normal. Tympanic membrane is not erythematous, not retracted and not bulging.  Left Ear: Hearing, tympanic membrane, external ear and ear canal normal. Tympanic  membrane is not erythematous, not retracted and not bulging.  Nose: No mucosal edema or rhinorrhea. Right sinus exhibits no maxillary sinus tenderness and no frontal sinus tenderness. Left sinus exhibits no maxillary sinus tenderness and no frontal sinus tenderness.  Mouth/Throat: Uvula is midline, oropharynx is clear and moist and mucous membranes are normal.  Eyes: Conjunctivae, EOM and lids are normal. Pupils are equal, round, and reactive to light. Lids are everted and swept, no foreign bodies found.  Neck: Trachea normal and normal range of motion. Neck supple. Carotid bruit is not present. No thyroid mass and no thyromegaly present.  Cardiovascular: Normal rate, regular rhythm, S1 normal, S2 normal, normal heart sounds, intact distal pulses and normal pulses.  Exam reveals no gallop and no friction rub.   No murmur heard. Pulmonary/Chest: Effort normal and breath sounds normal. No tachypnea. No respiratory distress. She has no decreased breath sounds. She has no wheezes. She has no rhonchi. She has no rales.  Abdominal: Soft. Normal appearance and bowel sounds are normal. There is no tenderness.  Neurological: She is alert and oriented to person, place, and time. She has normal strength and normal reflexes. No cranial nerve deficit or sensory deficit. She exhibits normal muscle tone. She displays a negative Romberg sign. Coordination and gait normal. GCS eye subscore is 4. GCS verbal subscore is 5. GCS motor subscore is 6.  Nml cerebellar exam   No papilledema  Skin: Skin is warm, dry and  intact. No rash noted.  Psychiatric: She has a normal mood and affect. Her speech is normal and behavior is normal. Judgment and thought content normal. Her mood appears not anxious. Cognition and memory are normal. Cognition and memory are not impaired. She does not exhibit a depressed mood. She exhibits normal recent memory and normal remote memory.          Assessment & Plan:  The patient's  preventative maintenance and recommended screening tests for an annual wellness exam were reviewed in full today. Brought up to date unless services declined.  Counselled on the importance of diet, exercise, and its role in overall health and mortality. The patient's FH and SH was reviewed, including their home life, tobacco status, and drug and alcohol status.   Vaccines: Due for tdap, given flu today.  nonsmoker  Mammo; 03/2015 nml  PAP/DVE: PAP 2014 nml,  Per GYN, has appt schedule STD screen: HIV: refused Hep C: neg 2015 Colon:01/2014, Dr. Fuller Plan, polyps sessile , 1 polyp adenomatous, plan repeat in 5 years.

## 2015-04-16 NOTE — Addendum Note (Signed)
Addended by: Carter Kitten on: 04/16/2015 03:00 PM   Modules accepted: Orders

## 2015-04-16 NOTE — Progress Notes (Signed)
Pre visit review using our clinic review tool, if applicable. No additional management support is needed unless otherwise documented below in the visit note. 

## 2015-04-21 ENCOUNTER — Telehealth: Payer: Self-pay | Admitting: Family Medicine

## 2015-04-21 DIAGNOSIS — D582 Other hemoglobinopathies: Secondary | ICD-10-CM

## 2015-04-21 NOTE — Telephone Encounter (Signed)
Patient called to cancel her MRI of the Brain. She said she wasn't having the dizziness like she was and cant afford to have the MRI right now. She does want to have the Hematology Referral and please send her to [place] for that. Please put the hematology Referral into Epic and patient is aware that they will call her directly to schedule. Also, you wanted her to come back in 2 weeks for lab work and she wants to wait until December to do the labs. She wants to work on better exercise and diet and then come back to see how her labs are.

## 2015-04-22 NOTE — Telephone Encounter (Signed)
Jennah notified as instructed by telephone.

## 2015-04-22 NOTE — Telephone Encounter (Signed)
MRI cancelled,  heme referral sent. OK to wait on labs as elevated LFTs is chronic.

## 2015-04-22 NOTE — Addendum Note (Signed)
Addended by: Eliezer Lofts E on: 04/22/2015 09:14 AM   Modules accepted: Orders

## 2015-04-30 ENCOUNTER — Ambulatory Visit: Payer: BLUE CROSS/BLUE SHIELD | Admitting: Hematology and Oncology

## 2015-05-03 ENCOUNTER — Other Ambulatory Visit: Payer: BLUE CROSS/BLUE SHIELD

## 2015-05-05 ENCOUNTER — Ambulatory Visit (INDEPENDENT_AMBULATORY_CARE_PROVIDER_SITE_OTHER): Payer: BLUE CROSS/BLUE SHIELD | Admitting: Obstetrics and Gynecology

## 2015-05-05 ENCOUNTER — Encounter: Payer: Self-pay | Admitting: Obstetrics and Gynecology

## 2015-05-05 VITALS — BP 120/87 | HR 78 | Ht 66.0 in | Wt 181.0 lb

## 2015-05-05 DIAGNOSIS — Z124 Encounter for screening for malignant neoplasm of cervix: Secondary | ICD-10-CM | POA: Diagnosis not present

## 2015-05-05 DIAGNOSIS — Z1151 Encounter for screening for human papillomavirus (HPV): Secondary | ICD-10-CM | POA: Diagnosis not present

## 2015-05-05 DIAGNOSIS — Z01419 Encounter for gynecological examination (general) (routine) without abnormal findings: Secondary | ICD-10-CM

## 2015-05-05 DIAGNOSIS — Z6829 Body mass index (BMI) 29.0-29.9, adult: Secondary | ICD-10-CM | POA: Diagnosis not present

## 2015-05-05 NOTE — Progress Notes (Signed)
  Subjective:     Joy Yates is a 55 y.o. female with BMI 29 who is here for a comprehensive physical exam. The patient reports no problems. She has been postmenopausal for the past 5 years and finds that her hot flashes and mood swings are well controlled with [employer]. She is sexually active without issues and denies any urinary incontinence.   Social History   Social History  . Marital Status: Married    Spouse Name: N/A  . Number of Children: N/A  . Years of Education: N/A   Occupational History  . account specialist    Social History Main Topics  . Smoking status: Never Smoker   . Smokeless tobacco: Never Used  . Alcohol Use: 1.5 oz/week    3 drink(s) per week     Comment: wine  . Drug Use: No  . Sexual Activity: Yes   Other Topics Concern  . Not on file   Social History Narrative   Health Maintenance  Topic Date Due  . HIV Screening  07/26/1977  . INFLUENZA VACCINE  03/14/2016  . PAP SMEAR  04/24/2016  . COLONOSCOPY  02/06/2017  . MAMMOGRAM  04/08/2017  . TETANUS/TDAP  04/15/2025  . Hepatitis C Screening  Completed      Past Medical History  Diagnosis Date  . Hypertension   . Depression    Past Surgical History  Procedure Laterality Date  . Tonsillectomy    . Hemorrhoid surgery      X 2  . Wisdom tooth extraction  2000  . Appendectomy  05/2013   Family History  Problem Relation Age of Onset  . Aneurysm Mother   . Cerebral aneurysm Mother     NOVEMVER 2009  . Hypertension Mother   . Hypertension Sister   . Hypertension Brother   . Colon cancer Neg Hx   . Pancreatic cancer Neg Hx   . Stomach cancer Neg Hx   . Rectal cancer Neg Hx      Review of Systems Pertinent items are noted in HPI.   Objective:  Blood pressure 120/87, pulse 78, height 5\' 6"  (1.676 m), weight 181 lb (82.101 kg).     GENERAL: Well-developed, well-nourished female in no acute distress.  HEENT: Normocephalic, atraumatic. Sclerae anicteric.  NECK: Supple. Normal  thyroid.  LUNGS: Clear to auscultation bilaterally.  HEART: Regular rate and rhythm. BREASTS: Symmetric in size. No palpable masses or lymphadenopathy, skin changes, or nipple drainage. ABDOMEN: Soft, nontender, nondistended. No organomegaly. PELVIC: Normal external female genitalia. Vagina is pink and rugated.  Normal discharge. Normal appearing cervix. Uterus is normal in size. No adnexal mass or tenderness. EXTREMITIES: No cyanosis, clubbing, or edema, 2+ distal pulses.    Assessment:    Healthy female exam.      Plan:    pap smear collected Patient with normal mammogram in 03/2015 She had fasting labs done with her PCP recently Patient advised to continue monthly breast and vulva exam Patient will be contacted with any abnormal results See After Visit Summary for Counseling Recommendations

## 2015-05-07 LAB — CYTOLOGY - PAP

## 2015-07-16 ENCOUNTER — Inpatient Hospital Stay: Payer: BLUE CROSS/BLUE SHIELD

## 2015-07-16 ENCOUNTER — Inpatient Hospital Stay: Payer: BLUE CROSS/BLUE SHIELD | Admitting: Hematology and Oncology

## 2015-07-16 ENCOUNTER — Other Ambulatory Visit: Payer: BLUE CROSS/BLUE SHIELD

## 2015-10-20 ENCOUNTER — Other Ambulatory Visit: Payer: Self-pay | Admitting: Family Medicine

## 2015-10-20 MED ORDER — LISINOPRIL 2.5 MG PO TABS: ORAL_TABLET | ORAL | Status: DC

## 2015-10-20 NOTE — Telephone Encounter (Signed)
i have not seen her in 4 years - i cannot tell if she has been taking this or not. Please confirm with the patient if she has routinely been taking lisinopril.

## 2015-10-20 NOTE — Telephone Encounter (Signed)
Please refill, 90, 1 ref

## 2015-10-20 NOTE — Addendum Note (Signed)
Addended by: Owens Loffler on: 10/20/2015 02:25 PM   Modules accepted: Orders

## 2015-10-20 NOTE — Telephone Encounter (Signed)
Spoke with Endia and she states she is currently taking Lisinopril 2.5 mg daily for blood pressure.

## 2015-10-20 NOTE — Telephone Encounter (Signed)
Last office visit 04/16/2015.  Not on current medication list.  Refill?

## 2016-04-19 ENCOUNTER — Encounter: Payer: BLUE CROSS/BLUE SHIELD | Admitting: Family Medicine

## 2016-05-17 ENCOUNTER — Ambulatory Visit (INDEPENDENT_AMBULATORY_CARE_PROVIDER_SITE_OTHER): Payer: BLUE CROSS/BLUE SHIELD | Admitting: Family Medicine

## 2016-05-17 ENCOUNTER — Encounter: Payer: Self-pay | Admitting: Family Medicine

## 2016-05-17 VITALS — BP 140/90 | HR 70 | Temp 97.9°F | Ht 65.5 in | Wt 178.5 lb

## 2016-05-17 DIAGNOSIS — Z23 Encounter for immunization: Secondary | ICD-10-CM | POA: Diagnosis not present

## 2016-05-17 DIAGNOSIS — Z79899 Other long term (current) drug therapy: Secondary | ICD-10-CM | POA: Diagnosis not present

## 2016-05-17 DIAGNOSIS — K219 Gastro-esophageal reflux disease without esophagitis: Secondary | ICD-10-CM

## 2016-05-17 DIAGNOSIS — Z0000 Encounter for general adult medical examination without abnormal findings: Secondary | ICD-10-CM | POA: Diagnosis not present

## 2016-05-17 DIAGNOSIS — Z13220 Encounter for screening for lipoid disorders: Secondary | ICD-10-CM | POA: Diagnosis not present

## 2016-05-17 DIAGNOSIS — R5383 Other fatigue: Secondary | ICD-10-CM

## 2016-05-17 MED ORDER — LISINOPRIL 2.5 MG PO TABS: ORAL_TABLET | ORAL | 3 refills | Status: DC

## 2016-05-17 MED ORDER — PANTOPRAZOLE SODIUM 40 MG PO TBEC: 40.0000 mg | DELAYED_RELEASE_TABLET | Freq: Every day | ORAL | 5 refills | Status: DC

## 2016-05-17 NOTE — Patient Instructions (Signed)

## 2016-05-17 NOTE — Progress Notes (Signed)
Dr. Frederico Hamman T. Walda Hertzog, MD, [facility] [place], [address] Phone: [phone] Fax: [phone]  05/17/2016  Patient: Joy Yates, MRN: [id number], DOB: 03-30-1961, 55 y.o.  Primary Physician:  Owens Loffler, MD   Chief Complaint  Patient presents with  . Annual Exam   Subjective:   Joy Yates is a 55 y.o. pleasant patient who presents with the following:  Health Maintenance Summary Reviewed and updated, unless pt declines services.  Tobacco History Reviewed. Non-smoker Alcohol: No concerns, no excessive use Exercise Habits: rare, rec at least 30 mins 5 times a week STD concerns: none Drug Use: None Menses regular: yes Lumps or breast concerns: no Breast Cancer Family History: no  Just built a new house in [place]  Works at home for [employer] and had to leave and went to a [employer] and got some back pain back then.   Before the [address] exit, feels like swallowed candy.  Got to a convenience store and took some zantac. Felt better after taking that.  Zantac and prilosec -  ? No numbness or tingling.  Definitely gets some GERD after   BP Readings from Last 3 Encounters:  05/17/16 140/90  05/05/15 120/87  04/16/15 128/90    Drinking 1 glass of wine a day.  Wt Readings from Last 3 Encounters:  05/17/16 178 lb 8 oz (81 kg)  05/05/15 181 lb (82.1 kg)  04/16/15 180 lb 8 oz (81.9 kg)    Goal 15 pound weight loss   Health Maintenance  Topic Date Due  . HIV Screening  07/26/1977  . COLONOSCOPY  02/06/2017  . MAMMOGRAM  04/08/2017  . PAP SMEAR  05/04/2018  . TETANUS/TDAP  04/15/2025  . INFLUENZA VACCINE  Completed  . Hepatitis C Screening  Completed    Immunization History  Administered Date(s) Administered  . Hepatitis B 07/20/2008, 10/15/2008, 12/15/2010  . Influenza,inj,Quad PF,36+ Mos 06/04/2014, 04/16/2015, 05/17/2016  . Td 08/15/2003  . Tdap 04/16/2015   Patient  Active Problem List   Diagnosis Date Noted  . Chronic headache 04/16/2015  . Dizziness and giddiness 12/05/2013  . DEPRESSIVE DISORDER 07/20/2008  . Essential hypertension, benign 07/20/2008   Past Medical History:  Diagnosis Date  . Depression   . Hypertension    Past Surgical History:  Procedure Laterality Date  . APPENDECTOMY  05/2013  . HEMORRHOID SURGERY     X 2  . TONSILLECTOMY    . [place] EXTRACTION  2000   Social History   Social History  . Marital status: Married    Spouse name: N/A  . Number of children: N/A  . Years of education: N/A   Occupational History  . account specialist    Social History Main Topics  . Smoking status: Never Smoker  . Smokeless tobacco: Never Used  . Alcohol use 1.5 oz/week    3 drink(s) per week     Comment: wine  . Drug use: No  . Sexual activity: Yes   Other Topics Concern  . Not on file   Social History Narrative  . No narrative on file   Family History  Problem Relation Age of Onset  . Aneurysm Mother   . Cerebral aneurysm Mother     NOVEMVER 2009  . Hypertension Mother   . Hypertension Sister   . Hypertension Brother   . Colon cancer Neg Hx   . Pancreatic cancer Neg Hx   .  Stomach cancer Neg Hx   . Rectal cancer Neg Hx    No Known Allergies  Medication list has been reviewed and updated.   General: Denies fever, chills, sweats. No significant weight loss. Eyes: Denies blurring,significant itching ENT: Denies earache, sore throat, and hoarseness.  Cardiovascular: Denies chest pains, palpitations, dyspnea on exertion,  Respiratory: Denies cough, dyspnea at rest,wheeezing Breast: no concerns about lumps GI: Denies nausea, vomiting, diarrhea, constipation, change in bowel habits, abdominal pain, melena, hematochezia GU: Denies dysuria, hematuria, urinary hesitancy, nocturia, denies STD risk, no concerns about discharge Musculoskeletal: Denies back pain, joint pain Derm: Denies rash, itching Neuro:  Denies  paresthesias, frequent falls, frequent headaches Psych: Denies depression, anxiety Endocrine: Denies cold intolerance, heat intolerance, polydipsia Heme: Denies enlarged lymph nodes Allergy: No hayfever  Objective:   BP 140/90   Pulse 70   Temp 97.9 F (36.6 C) (Oral)   Ht 5' 5.5" (1.664 m)   Wt 178 lb 8 oz (81 kg)   BMI 29.25 kg/m  No exam data present  GEN: well developed, well nourished, no acute distress Eyes: conjunctiva and lids normal, PERRLA, EOMI ENT: TM clear, nares clear, oral exam WNL Neck: supple, no lymphadenopathy, no thyromegaly, no JVD Pulm: clear to auscultation and percussion, respiratory effort normal CV: regular rate and rhythm, S1-S2, no murmur, rub or gallop, no bruits Chest: no scars, masses, no lumps BREAST: breast exam declined GI: soft, non-tender; no hepatosplenomegaly, masses; active bowel sounds all quadrants GU: GU exam declined Lymph: no cervical, axillary or inguinal adenopathy MSK: gait normal, muscle tone and strength WNL, no joint swelling, effusions, discoloration, crepitus  SKIN: clear, good turgor, color WNL, no rashes, lesions, or ulcerations Neuro: normal mental status, normal strength, sensation, and motion Psych: alert; oriented to person, place and time, normally interactive and not anxious or depressed in appearance.   All labs reviewed with patient. Lipids:    Component Value Date/Time   CHOL 228 (H) 04/16/2015 1504   TRIG 153 (H) 04/16/2015 1504   HDL 44 (L) 04/16/2015 1504   LDLDIRECT 151.5 02/28/2012 1316   VLDL 31 (H) 04/16/2015 1504   CHOLHDL 5.2 (H) 04/16/2015 1504   CBC: CBC Latest Ref Rng & Units 04/16/2015 12/05/2013 09/25/2012  WBC 4.0 - 10.5 K/uL 7.0 6.8 6.5  Hemoglobin 12.0 - 15.0 g/dL 16.4(H) 16.3(H) 15.3(H)  Hematocrit 36.0 - 46.0 % 46.3(H) 47.5(H) 43.9  Platelets 150 - 400 K/uL 289 295.0 330.0    Basic Metabolic Panel:    Component Value Date/Time   NA 144 04/16/2015 1504   K 3.9 04/16/2015 1504    CL 102 04/16/2015 1504   CO2 29 04/16/2015 1504   BUN 9 04/16/2015 1504   CREATININE 0.65 04/16/2015 1504   GLUCOSE 96 04/16/2015 1504   CALCIUM 9.6 04/16/2015 1504   Hepatic Function Latest Ref Rng & Units 04/16/2015 03/27/2014 12/12/2013  Total Protein 6.1 - 8.1 g/dL 6.8 7.0 6.2  Albumin 3.6 - 5.1 g/dL 4.5 4.3 4.2  AST 10 - 35 U/L 26 30 27   ALT 6 - 29 U/L 47(H) 42(H) 47(H)  Alk Phosphatase 33 - 130 U/L 72 74 78  Total Bilirubin 0.2 - 1.2 mg/dL 0.7 1.3(H) 0.4  Bilirubin, Direct 0.0 - 0.3 mg/dL - 0.1 0.1    Lab Results  Component Value Date   TSH 0.97 12/05/2013   No results found.  Assessment and Plan:   Routine general medical examination at a health care facility  Need for prophylactic vaccination and inoculation  against influenza - Plan: Flu Vaccine QUAD 36+ mos IM  Screening, lipid - Plan: Lipid panel  Encounter for long-term current use of medication - Plan: Basic metabolic panel, CBC with Differential/Platelet, Hepatic function panel  Other fatigue - Plan: TSH  Undertreated GERD  Health Maintenance Exam: The patient's preventative maintenance and recommended screening tests for an annual wellness exam were reviewed in full today. Brought up to date unless services declined.  Counselled on the importance of diet, exercise, and its role in overall health and mortality. The patient's FH and SH was reviewed, including their home life, tobacco status, and drug and alcohol status.  Follow-up: No Follow-up on file. Or follow-up in 1 year for complete physical examination  New Prescriptions   PANTOPRAZOLE (PROTONIX) 40 MG TABLET    Take 1 tablet (40 mg total) by mouth daily.   Modified Medications   Modified Medication Previous Medication   LISINOPRIL (PRINIVIL,ZESTRIL) 2.5 MG TABLET lisinopril (PRINIVIL,ZESTRIL) 2.5 MG tablet      TAKE 1 TABLET (2.5 MG TOTAL) BY MOUTH DAILY.    TAKE 1 TABLET (2.5 MG TOTAL) BY MOUTH DAILY.   Orders Placed This Encounter  Procedures   . Flu Vaccine QUAD 36+ mos IM  . Basic metabolic panel  . CBC with Differential/Platelet  . Hepatic function panel  . Lipid panel  . TSH    Signed,  Frederico Hamman T. Adebayo Ensminger, MD   Patient's Medications  New Prescriptions   PANTOPRAZOLE (PROTONIX) 40 MG TABLET    Take 1 tablet (40 mg total) by mouth daily.  Previous Medications   BLACK COHOSH 540 MG CAPS    Take 1 capsule by mouth daily.   MULTIPLE VITAMIN (MULTIVITAMIN) TABLET    Take 1 tablet by mouth daily.     ST JOHNS WORT PO    Take by mouth.    Modified Medications   Modified Medication Previous Medication   LISINOPRIL (PRINIVIL,ZESTRIL) 2.5 MG TABLET lisinopril (PRINIVIL,ZESTRIL) 2.5 MG tablet      TAKE 1 TABLET (2.5 MG TOTAL) BY MOUTH DAILY.    TAKE 1 TABLET (2.5 MG TOTAL) BY MOUTH DAILY.  Discontinued Medications   RANITIDINE (ZANTAC) 150 MG TABLET    Take 150 mg by mouth daily as needed for heartburn.

## 2016-05-17 NOTE — Progress Notes (Signed)
Pre visit review using our clinic review tool, if applicable. No additional management support is needed unless otherwise documented below in the visit note. 

## 2016-05-18 LAB — CBC WITH DIFFERENTIAL/PLATELET
Basophils Absolute: 0 10*3/uL (ref 0.0–0.1)
Basophils Relative: 0.5 % (ref 0.0–3.0)
Eosinophils Absolute: 0.3 10*3/uL (ref 0.0–0.7)
Eosinophils Relative: 4 % (ref 0.0–5.0)
HCT: 48.4 % — ABNORMAL HIGH (ref 36.0–46.0)
Hemoglobin: 16.6 g/dL — ABNORMAL HIGH (ref 12.0–15.0)
Lymphocytes Relative: 27.6 % (ref 12.0–46.0)
Lymphs Abs: 2.3 10*3/uL (ref 0.7–4.0)
MCHC: 34.2 g/dL (ref 30.0–36.0)
MCV: 85.9 fL (ref 78.0–100.0)
Monocytes Absolute: 0.8 10*3/uL (ref 0.1–1.0)
Monocytes Relative: 10.3 % (ref 3.0–12.0)
Neutro Abs: 4.8 10*3/uL (ref 1.4–7.7)
Neutrophils Relative %: 57.6 % (ref 43.0–77.0)
Platelets: 293 10*3/uL (ref 150.0–400.0)
RBC: 5.64 Mil/uL — ABNORMAL HIGH (ref 3.87–5.11)
RDW: 12.9 % (ref 11.5–15.5)
WBC: 8.3 10*3/uL (ref 4.0–10.5)

## 2016-05-18 LAB — BASIC METABOLIC PANEL
BUN: 11 mg/dL (ref 6–23)
CO2: 33 mEq/L — ABNORMAL HIGH (ref 19–32)
Calcium: 9.2 mg/dL (ref 8.4–10.5)
Chloride: 103 mEq/L (ref 96–112)
Creatinine, Ser: 0.7 mg/dL (ref 0.40–1.20)
GFR: 92.75 mL/min (ref >60.00)
Glucose, Bld: 92 mg/dL (ref 70–99)
Potassium: 4.3 mEq/L (ref 3.5–5.1)
Sodium: 142 meq/L (ref 135–145)

## 2016-05-18 LAB — HEPATIC FUNCTION PANEL
ALT: 35 U/L (ref 0–35)
AST: 24 U/L (ref 0–37)
Albumin: 4.3 g/dL (ref 3.5–5.2)
Alkaline Phosphatase: 77 U/L (ref 39–117)
Bilirubin, Direct: 0 mg/dL (ref 0.0–0.3)
Total Bilirubin: 0.5 mg/dL (ref 0.2–1.2)
Total Protein: 7.1 g/dL (ref 6.0–8.3)

## 2016-05-18 LAB — TSH: TSH: 1.69 u[IU]/mL (ref 0.35–4.50)

## 2016-05-18 LAB — LIPID PANEL
Cholesterol: 208 mg/dL — ABNORMAL HIGH (ref 0–200)
HDL: 41.1 mg/dL (ref >39.00)
NonHDL: 167.27
Total CHOL/HDL Ratio: 5
Triglycerides: 239 mg/dL — ABNORMAL HIGH (ref 0.0–149.0)
VLDL: 47.8 mg/dL — ABNORMAL HIGH (ref 0.0–40.0)

## 2016-05-18 LAB — LDL CHOLESTEROL, DIRECT: Direct LDL: 145 mg/dL

## 2016-05-22 ENCOUNTER — Other Ambulatory Visit: Payer: Self-pay | Admitting: Family Medicine

## 2016-05-22 DIAGNOSIS — Z1231 Encounter for screening mammogram for malignant neoplasm of breast: Secondary | ICD-10-CM

## 2016-05-29 ENCOUNTER — Ambulatory Visit
Admission: RE | Admit: 2016-05-29 | Discharge: 2016-05-29 | Disposition: A | Discharge status: Home / Self Care | Payer: BLUE CROSS/BLUE SHIELD | Source: Ambulatory Visit | Attending: Family Medicine | Admitting: Family Medicine

## 2016-05-29 ENCOUNTER — Other Ambulatory Visit: Payer: Self-pay | Admitting: Family Medicine

## 2016-05-29 DIAGNOSIS — Z1231 Encounter for screening mammogram for malignant neoplasm of breast: Secondary | ICD-10-CM

## 2016-11-28 ENCOUNTER — Encounter: Payer: Self-pay | Admitting: Gastroenterology

## 2016-12-13 ENCOUNTER — Encounter: Payer: Self-pay | Admitting: Gastroenterology

## 2017-02-08 ENCOUNTER — Ambulatory Visit (AMBULATORY_SURGERY_CENTER): Payer: Self-pay

## 2017-02-08 VITALS — Ht 66.0 in | Wt 180.4 lb

## 2017-02-08 DIAGNOSIS — Z860100 Personal history of colon polyps, unspecified: Secondary | ICD-10-CM

## 2017-02-08 MED ORDER — NA SULFATE-K SULFATE-MG SULF 17.5-3.13-1.6 GM/177ML PO SOLN: 1.0000 | Freq: Once | ORAL | 0 refills | Status: AC

## 2017-02-08 NOTE — Progress Notes (Signed)
Denies allergies to eggs or soy products. Denies complication of anesthesia or sedation. Denies use of weight loss medication. Denies use of O2.   Emmi instructions given for colonoscopy.  

## 2017-02-09 ENCOUNTER — Encounter: Payer: Self-pay | Admitting: Gastroenterology

## 2017-02-23 ENCOUNTER — Encounter: Payer: Self-pay | Admitting: Gastroenterology

## 2017-02-23 ENCOUNTER — Ambulatory Visit (AMBULATORY_SURGERY_CENTER): Payer: BLUE CROSS/BLUE SHIELD | Admitting: Gastroenterology

## 2017-02-23 VITALS — BP 141/89 | HR 68 | Temp 98.9°F | Resp 18 | Ht 66.0 in | Wt 180.0 lb

## 2017-02-23 DIAGNOSIS — D120 Benign neoplasm of cecum: Secondary | ICD-10-CM | POA: Diagnosis not present

## 2017-02-23 DIAGNOSIS — Z860100 Personal history of colon polyps, unspecified: Secondary | ICD-10-CM

## 2017-02-23 DIAGNOSIS — D123 Benign neoplasm of transverse colon: Secondary | ICD-10-CM

## 2017-02-23 DIAGNOSIS — K635 Polyp of colon: Secondary | ICD-10-CM

## 2017-02-23 MED ORDER — SODIUM CHLORIDE 0.9 % IV SOLN: 500.0000 mL | INTRAVENOUS | Status: AC

## 2017-02-23 NOTE — Progress Notes (Signed)
Called to room to assist during endoscopic procedure.  Patient ID and intended procedure confirmed with present staff. Received instructions for my participation in the procedure from the performing physician.  

## 2017-02-23 NOTE — Progress Notes (Signed)
To PACU VSS Report to RN 

## 2017-02-23 NOTE — Op Note (Signed)
[facility] Patient Name: Joy Yates Procedure Date: 02/23/2017 10:24 AM MRN: [id number] Endoscopist: Ladene Artist , MD Age: 56 Referring MD:  Date of Birth: October 24, 1960 Gender: Female Account #: [id number] Procedure:                Colonoscopy Indications:              Surveillance: Personal history of adenomatous                            polyps on last colonoscopy 3 years ago Medicines:                Monitored Anesthesia Care Procedure:                Pre-Anesthesia Assessment:                           - Prior to the procedure, a History and Physical                            was performed, and patient medications and                            allergies were reviewed. The patient's tolerance of                            previous anesthesia was also reviewed. The risks                            and benefits of the procedure and the sedation                            options and risks were discussed with the patient.                            All questions were answered, and informed consent                            was obtained. Prior Anticoagulants: The patient has                            taken no previous anticoagulant or antiplatelet                            agents. ASA Grade Assessment: II - A patient with                            mild systemic disease. After reviewing the risks                            and benefits, the patient was deemed in                            satisfactory condition to undergo the procedure.  After obtaining informed consent, the colonoscope                            was passed under direct vision. Throughout the                            procedure, the patient's blood pressure, pulse, and                            oxygen saturations were monitored continuously. The                            Colonoscope was introduced through the anus and                            advanced to the the  cecum, identified by                            appendiceal orifice and ileocecal valve. The                            ileocecal valve, appendiceal orifice, and rectum                            were photographed. The quality of the bowel                            preparation was excellent. The colonoscopy was                            performed without difficulty. The patient tolerated                            the procedure well. Scope In: 10:38:42 AM Scope Out: 10:54:25 AM Scope Withdrawal Time: 0 hours 12 minutes 24 seconds  Total Procedure Duration: 0 hours 15 minutes 43 seconds  Findings:                 The perianal and digital rectal examinations were                            normal.                           A 6 mm polyp was found in the transverse colon. The                            polyp was sessile. The polyp was removed with a                            cold snare. Resection and retrieval were complete.                           Two sessile polyps were found in the cecum. The  polyps were 8 to 9 mm in size. These polyps were                            removed with a hot snare. Resection and retrieval                            were complete.                           The exam was otherwise without abnormality on                            direct and retroflexion views. Complications:            No immediate complications. Estimated blood loss:                            None. Estimated Blood Loss:     Estimated blood loss: none. Impression:               - One 6 mm polyp in the transverse colon, removed                            with a cold snare. Resected and retrieved.                           - Two 8 to 9 mm polyps in the cecum, removed with a                            hot snare. Resected and retrieved.                           - The examination was otherwise normal on direct                            and retroflexion  views. Recommendation:           - Repeat colonoscopy in 3 years for surveillance.                           - Patient has a contact number available for                            emergencies. The signs and symptoms of potential                            delayed complications were discussed with the                            patient. Return to normal activities tomorrow.                            Written discharge instructions were provided to the  patient.                           - Resume previous diet.                           - Continue present medications.                           - Await pathology results.                           - No aspirin, ibuprofen, naproxen, or other                            non-steroidal anti-inflammatory drugs for 2 weeks                            after polyp removal. Ladene Artist, MD 02/23/2017 10:58:58 AM This report has been signed electronically.

## 2017-02-23 NOTE — Patient Instructions (Signed)
Impression/Recommendations:  Polyp handout given to patient.  Repeat colonoscopy in 3 years for surveillance.  No aspirin, ibuprofen, naproxen, or other NSAID drugs for 2 weeks.   Tylenol only until March 10, 2017.  Continue present medications. Resume Previous diet.  YOU HAD AN ENDOSCOPIC PROCEDURE TODAY AT [facility]:   Refer to the procedure report that was given to you for any specific questions about what was found during the examination.  If the procedure report does not answer your questions, please call your gastroenterologist to clarify.  If you requested that your care partner not be given the details of your procedure findings, then the procedure report has been included in a sealed envelope for you to review at your convenience later.  YOU SHOULD EXPECT: Some feelings of bloating in the abdomen. Passage of more gas than usual.  Walking can help get rid of the air that was put into your GI tract during the procedure and reduce the bloating. If you had a lower endoscopy (such as a colonoscopy or flexible sigmoidoscopy) you may notice spotting of blood in your stool or on the toilet paper. If you underwent a bowel prep for your procedure, you may not have a normal bowel movement for a few days.  Please Note:  You might notice some irritation and congestion in your nose or some drainage.  This is from the oxygen used during your procedure.  There is no need for concern and it should clear up in a day or so.  SYMPTOMS TO REPORT IMMEDIATELY:   Following lower endoscopy (colonoscopy or flexible sigmoidoscopy):  Excessive amounts of blood in the stool  Significant tenderness or worsening of abdominal pains  Swelling of the abdomen that is new, acute  Fever of 100F or higher For urgent or emergent issues, a gastroenterologist can be reached at any hour by calling [phone].   DIET:  We do recommend a small meal at first, but then you may proceed to your regular  diet.  Drink plenty of fluids but you should avoid alcoholic beverages for 24 hours.  ACTIVITY:  You should plan to take it easy for the rest of today and you should NOT DRIVE or use heavy machinery until tomorrow (because of the sedation medicines used during the test).    FOLLOW UP: Our staff will call the number listed on your records the next business day following your procedure to check on you and address any questions or concerns that you may have regarding the information given to you following your procedure. If we do not reach you, we will leave a message.  However, if you are feeling well and you are not experiencing any problems, there is no need to return our call.  We will assume that you have returned to your regular daily activities without incident.  If any biopsies were taken you will be contacted by phone or by letter within the next 1-3 weeks.  Please call us at [phone] if you have not heard about the biopsies in 3 weeks.    SIGNATURES/CONFIDENTIALITY: You and/or your care partner have signed paperwork which will be entered into your electronic medical record.  These signatures attest to the fact that that the information above on your After Visit Summary has been reviewed and is understood.  Full responsibility of the confidentiality of this discharge information lies with you and/or your care-partner.

## 2017-02-26 ENCOUNTER — Telehealth: Payer: Self-pay

## 2017-02-26 NOTE — Telephone Encounter (Signed)
  Follow up Call-  Call back number 02/23/2017  Post procedure Call Back phone  # [phone]  Permission to leave phone message No  Some recent data might be hidden     Patient questions:  Do you have a fever, pain , or abdominal swelling? No. Pain Score  0 *  Have you tolerated food without any problems? Yes.    Have you been able to return to your normal activities? Yes.    Do you have any questions about your discharge instructions: Diet   No. Medications  No. Follow up visit  No.  Do you have questions or concerns about your Care? No.  Actions: * If pain score is 4 or above: No action needed, pain <4.

## 2017-02-28 ENCOUNTER — Encounter: Payer: Self-pay | Admitting: Gastroenterology

## 2017-03-01 ENCOUNTER — Ambulatory Visit (INDEPENDENT_AMBULATORY_CARE_PROVIDER_SITE_OTHER): Payer: BLUE CROSS/BLUE SHIELD | Admitting: Family Medicine

## 2017-03-01 ENCOUNTER — Encounter: Payer: Self-pay | Admitting: Family Medicine

## 2017-03-01 VITALS — BP 130/80 | HR 76 | Temp 98.2°F | Ht 65.5 in | Wt 179.5 lb

## 2017-03-01 DIAGNOSIS — L729 Follicular cyst of the skin and subcutaneous tissue, unspecified: Secondary | ICD-10-CM

## 2017-03-01 DIAGNOSIS — I10 Essential (primary) hypertension: Secondary | ICD-10-CM

## 2017-03-01 MED ORDER — HYDROCHLOROTHIAZIDE 12.5 MG PO TABS
12.5000 mg | ORAL_TABLET | Freq: Every day | ORAL | 3 refills | Status: DC
Start: 2017-03-01 — End: 2017-10-04

## 2017-03-01 NOTE — Progress Notes (Signed)
Dr. Frederico Hamman T. Aurthur Wingerter, MD, [facility] Medicine [place], [address] Phone: [phone] Fax: [phone]  03/01/2017  Patient: Joy Yates, MRN: [id number], DOB: Jun 05, 1961, 56 y.o.  Primary Physician:  Owens Loffler, MD   Chief Complaint  Patient presents with  . Hypertension    Wants to change blood pressure medicine  . Mass    Right Buttocks-Wants removed   Subjective:   Joy Yates is a 56 y.o. very pleasant female patient who presents with the following:  Wants to change BP meds. HCTZ She read on the Internet about various side effects from some lisinopril, and wanted to change to hydrochlorothiazide.  She also has a long-standing cyst on her buttocks and she wants this removed electively.  Past Medical History, Surgical History, Social History, Family History, Problem List, Medications, and Allergies have been reviewed and updated if relevant.  Patient Active Problem List   Diagnosis Date Noted  . Chronic headache 04/16/2015  . Dizziness and giddiness 12/05/2013  . DEPRESSIVE DISORDER 07/20/2008  . Essential hypertension, benign 07/20/2008    Past Medical History:  Diagnosis Date  . Depression   . GERD (gastroesophageal reflux disease)   . Hypertension     Past Surgical History:  Procedure Laterality Date  . APPENDECTOMY  05/2013  . COLONOSCOPY    . HEMORRHOID SURGERY     X 2  . TONSILLECTOMY    . [place] EXTRACTION  2000    Social History   Social History  . Marital status: Married    Spouse name: N/A  . Number of children: N/A  . Years of education: N/A   Occupational History  . account specialist    Social History Main Topics  . Smoking status: Never Smoker  . Smokeless tobacco: Never Used  . Alcohol use 1.5 oz/week    3 Standard drinks or equivalent per week     Comment: wine and some alcoholic drinks  . Drug use: No  . Sexual activity: Yes   Other Topics Concern    . Not on file   Social History Narrative  . No narrative on file    Family History  Problem Relation Age of Onset  . Aneurysm Mother   . Cerebral aneurysm Mother        NOVEMVER 2009  . Hypertension Mother   . Hypertension Sister   . Hypertension Brother   . Colon cancer Neg Hx   . Pancreatic cancer Neg Hx   . Stomach cancer Neg Hx   . Rectal cancer Neg Hx   . Esophageal cancer Neg Hx     No Known Allergies  Medication list reviewed and updated in full in [facility].   GEN: No acute illnesses, no fevers, chills. GI: No n/v/d, eating normally Pulm: No SOB Interactive and getting along well at home.  Otherwise, ROS is as per the HPI.  Objective:   BP 130/80   Pulse 76   Temp 98.2 F (36.8 C) (Oral)   Ht 5' 5.5" (1.664 m)   Wt 179 lb 8 oz (81.4 kg)   BMI 29.42 kg/m   GEN: WDWN, NAD, Non-toxic, A & O x 3 HEENT: Atraumatic, Normocephalic. Neck supple. No masses, No LAD. Ears and Nose: No external deformity. CV: RRR, No M/G/R. No JVD. No thrill. No extra heart sounds. PULM: CTA B, no wheezes, crackles, rhonchi. No retractions. No resp. distress. No accessory muscle use. EXTR: No  c/c/e NEURO Normal gait.  PSYCH: Normally interactive. Conversant. Not depressed or anxious appearing.  Calm demeanor.   On the right lower buttock there is an area that is easily mobile and has a consistency of a likely cyst. This portion of the physical examination was chaperoned by Hedy Camara, [place].   Laboratory and Imaging Data:  Assessment and Plan:   Essential hypertension, benign  Cyst of buttocks - Plan: Ambulatory referral to General Surgery  HCTZ is reasonable, along with elective cyst removal.  Follow-up: No Follow-up on file.  Meds ordered this encounter  Medications  . hydrochlorothiazide (HYDRODIURIL) 12.5 MG tablet    Sig: Take 1 tablet (12.5 mg total) by mouth daily.    Dispense:  90 tablet    Refill:  3   Medications Discontinued During This  Encounter  Medication Reason  . omeprazole (PRILOSEC) 20 MG capsule Ineffective  . lisinopril (PRINIVIL,ZESTRIL) 2.5 MG tablet    Orders Placed This Encounter  Procedures  . Ambulatory referral to General Surgery    Signed,  Frederico Hamman T. Dawan Farney, MD   Allergies as of 03/01/2017   No Known Allergies     Medication List       Accurate as of 03/01/17 11:59 PM. Always use your most recent med list.          Black Cohosh 540 MG Caps Take 1 capsule by mouth daily.   hydrochlorothiazide 12.5 MG tablet Commonly known as:  HYDRODIURIL Take 1 tablet (12.5 mg total) by mouth daily.   multivitamin tablet Take 1 tablet by mouth daily.   ST JOHNS WORT PO Take by mouth.

## 2017-03-01 NOTE — Patient Instructions (Signed)

## 2017-03-19 ENCOUNTER — Telehealth: Payer: Self-pay

## 2017-03-19 NOTE — Telephone Encounter (Signed)
Pt left v/m; pt seen 03/01/17 and pt started on HCTZ 12.5 mg; pt request HCTZ dosage decreased; after taking HCTZ 12.5 mg makes pt feel woozy and fainty. [facility].Please advise.

## 2017-03-19 NOTE — Telephone Encounter (Signed)
Patient returned Donna's call. I let patient know it was the lowest dose and  Dr Lorelei Pont said to stop it and see how she feels after it get out of her system.  Patient voiced understanding.

## 2017-03-19 NOTE — Telephone Encounter (Signed)
This is the lowest dose. I would stop it entirely and see how she does after it gets out of her system.

## 2017-06-07 ENCOUNTER — Ambulatory Visit (INDEPENDENT_AMBULATORY_CARE_PROVIDER_SITE_OTHER): Payer: BLUE CROSS/BLUE SHIELD

## 2017-06-07 DIAGNOSIS — Z23 Encounter for immunization: Secondary | ICD-10-CM | POA: Diagnosis not present

## 2017-10-04 ENCOUNTER — Other Ambulatory Visit: Payer: Self-pay | Admitting: Family Medicine

## 2017-10-04 ENCOUNTER — Telehealth: Payer: Self-pay | Admitting: Family Medicine

## 2017-10-04 MED ORDER — LISINOPRIL 2.5 MG PO TABS: 2.5000 mg | ORAL_TABLET | Freq: Every day | ORAL | 1 refills | Status: DC

## 2017-10-04 NOTE — Telephone Encounter (Signed)
Patient returned call, I advised the lisinopril is not on her medication profile. She says "I don't know why because the last time I had it filled was 10/2016." I advised Hydrochlorothiazide is the only BP medication listed and asked was she ever on the combination drug with both in it, she says "I was taking hydrochlorothiazide and I didn't like how it was making me feel, so I started back taking the lisinopril 2.5 mg daily, which the doctor was aware. I have an appointment coming up in May, so I just need enough to get me through until my appointment." I advised this would be sent to Dr. Lorelei Pont for approval, she verbalized understanding.

## 2017-10-04 NOTE — Telephone Encounter (Signed)
She has taken this for years. I am aware, will refill.

## 2017-10-04 NOTE — Telephone Encounter (Signed)
Attempted to call pt regarding refill request but no answer at this time. Lisinopril not showing in pt's current medication list. Unable to leave message due to voicemail not being set up.

## 2017-10-04 NOTE — Telephone Encounter (Signed)
Copied from [place] [phone]. Topic: Quick Communication - Rx Refill/Question >> Oct 04, 2017  1:42 PM Joy Yates, NT wrote: Medication: Lisinopril 2.5   Has the patient contacted their pharmacy? Yes    (Agent: If no, request that the patient contact the pharmacy for the refill.)   Preferred Pharmacy (with phone number or street name): [id number] [place], [place] [phone] (Phone) [phone] (Fax)     Agent: Please be advised that RX refills may take up to 3 business days. We ask that you follow-up with your pharmacy.

## 2017-10-31 ENCOUNTER — Ambulatory Visit: Payer: BLUE CROSS/BLUE SHIELD | Admitting: Family Medicine

## 2017-10-31 ENCOUNTER — Ambulatory Visit: Payer: BLUE CROSS/BLUE SHIELD | Admitting: Internal Medicine

## 2018-01-02 ENCOUNTER — Encounter: Payer: BLUE CROSS/BLUE SHIELD | Admitting: Family Medicine

## 2018-01-02 ENCOUNTER — Ambulatory Visit: Payer: Self-pay | Admitting: Family Medicine

## 2018-03-26 ENCOUNTER — Other Ambulatory Visit: Payer: Self-pay | Admitting: Family Medicine

## 2018-05-22 ENCOUNTER — Ambulatory Visit (INDEPENDENT_AMBULATORY_CARE_PROVIDER_SITE_OTHER): Payer: BLUE CROSS/BLUE SHIELD | Admitting: Family Medicine

## 2018-05-22 ENCOUNTER — Encounter: Payer: Self-pay | Admitting: Family Medicine

## 2018-05-22 VITALS — BP 130/90 | HR 69 | Temp 98.3°F | Ht 65.5 in | Wt 176.0 lb

## 2018-05-22 DIAGNOSIS — Z23 Encounter for immunization: Secondary | ICD-10-CM | POA: Diagnosis not present

## 2018-05-22 DIAGNOSIS — E559 Vitamin D deficiency, unspecified: Secondary | ICD-10-CM

## 2018-05-22 DIAGNOSIS — Z0000 Encounter for general adult medical examination without abnormal findings: Secondary | ICD-10-CM

## 2018-05-22 LAB — CBC WITH DIFFERENTIAL/PLATELET
Basophils Absolute: 0 10*3/uL (ref 0.0–0.1)
Basophils Relative: 0.4 % (ref 0.0–3.0)
Eosinophils Absolute: 0.4 10*3/uL (ref 0.0–0.7)
Eosinophils Relative: 6.5 % — ABNORMAL HIGH (ref 0.0–5.0)
HCT: 46.9 % — ABNORMAL HIGH (ref 36.0–46.0)
Hemoglobin: 16.1 g/dL — ABNORMAL HIGH (ref 12.0–15.0)
Lymphocytes Relative: 26.2 % (ref 12.0–46.0)
Lymphs Abs: 1.6 10*3/uL (ref 0.7–4.0)
MCHC: 34.4 g/dL (ref 30.0–36.0)
MCV: 85.7 fl (ref 78.0–100.0)
Monocytes Absolute: 0.6 10*3/uL (ref 0.1–1.0)
Monocytes Relative: 9.7 % (ref 3.0–12.0)
Neutro Abs: 3.5 10*3/uL (ref 1.4–7.7)
Neutrophils Relative %: 57.2 % (ref 43.0–77.0)
Platelets: 280 10*3/uL (ref 150.0–400.0)
RBC: 5.48 Mil/uL — ABNORMAL HIGH (ref 3.87–5.11)
RDW: 13 % (ref 11.5–15.5)
WBC: 6.2 10*3/uL (ref 4.0–10.5)

## 2018-05-22 LAB — BASIC METABOLIC PANEL
BUN: 14 mg/dL (ref 6–23)
CO2: 31 mEq/L (ref 19–32)
Calcium: 9.6 mg/dL (ref 8.4–10.5)
Chloride: 105 mEq/L (ref 96–112)
Creatinine, Ser: 0.67 mg/dL (ref 0.40–1.20)
GFR: 96.83 mL/min (ref >60.00)
Glucose, Bld: 113 mg/dL — ABNORMAL HIGH (ref 70–99)
Potassium: 3.9 mEq/L (ref 3.5–5.1)
Sodium: 142 meq/L (ref 135–145)

## 2018-05-22 LAB — LIPID PANEL
Cholesterol: 211 mg/dL — ABNORMAL HIGH (ref 0–200)
HDL: 46.9 mg/dL (ref >39.00)
LDL Cholesterol: 144 mg/dL — ABNORMAL HIGH (ref 0–99)
NonHDL: 164.3
Total CHOL/HDL Ratio: 5
Triglycerides: 104 mg/dL (ref 0.0–149.0)
VLDL: 20.8 mg/dL (ref 0.0–40.0)

## 2018-05-22 LAB — HEPATIC FUNCTION PANEL
ALT: 35 U/L (ref 0–35)
AST: 20 U/L (ref 0–37)
Albumin: 4.4 g/dL (ref 3.5–5.2)
Alkaline Phosphatase: 65 U/L (ref 39–117)
Bilirubin, Direct: 0.1 mg/dL (ref 0.0–0.3)
Total Bilirubin: 0.7 mg/dL (ref 0.2–1.2)
Total Protein: 7.1 g/dL (ref 6.0–8.3)

## 2018-05-22 LAB — VITAMIN D 25 HYDROXY (VIT D DEFICIENCY, FRACTURES): VITD: 19.45 ng/mL — ABNORMAL LOW (ref 30.00–100.00)

## 2018-05-22 LAB — TSH: TSH: 1.1 u[IU]/mL (ref 0.35–4.50)

## 2018-05-22 NOTE — Patient Instructions (Signed)
Mammogram

## 2018-05-22 NOTE — Progress Notes (Signed)
Dr. Frederico Hamman T. Laketha Leopard, MD, [facility] [place], [address] Phone: [phone] Fax: [phone]  05/22/2018  Patient: Joy Yates, MRN: [id number], DOB: June 14, 1961, 57 y.o.  Primary Physician:  Owens Loffler, MD   Chief Complaint  Patient presents with  . Annual Exam   Subjective:   Joy Yates is a 57 y.o. pleasant patient who presents with the following:  Health Maintenance Summary Reviewed and updated, unless pt declines services.  Tobacco History Reviewed. Non-smoker Alcohol: No concerns, no excessive use Exercise Habits: Some activity, rec at least 30 mins 5 times a week STD concerns: none Drug Use: None Birth control method: Menses regular: yes Lumps or breast concerns: no Breast Cancer Family History: no  Some dizziness Has donated blood  Health Maintenance  Topic Date Due  . MAMMOGRAM  05/29/2018  . COLONOSCOPY  02/24/2020  . PAP SMEAR  05/04/2020  . TETANUS/TDAP  04/15/2025  . INFLUENZA VACCINE  Completed  . Hepatitis C Screening  Completed  . HIV Screening  Completed    Immunization History  Administered Date(s) Administered  . Hepatitis B 07/20/2008, 10/15/2008, 12/15/2010  . Influenza,inj,Quad PF,6+ Mos 06/04/2014, 04/16/2015, 05/17/2016, 06/07/2017, 05/22/2018  . Td 08/15/2003  . Tdap 04/16/2015   Patient Active Problem List   Diagnosis Date Noted  . Chronic headache 04/16/2015  . Dizziness and giddiness 12/05/2013  . DEPRESSIVE DISORDER 07/20/2008  . Essential hypertension, benign 07/20/2008   Past Medical History:  Diagnosis Date  . Depression   . GERD (gastroesophageal reflux disease)   . Hypertension    Past Surgical History:  Procedure Laterality Date  . APPENDECTOMY  05/2013  . COLONOSCOPY    . HEMORRHOID SURGERY     X 2  . TONSILLECTOMY    . WISDOM TOOTH EXTRACTION  2000   Social History   Socioeconomic History  . Marital status: Married    Spouse  name: Not on file  . Number of children: Not on file  . Years of education: Not on file  . Highest education level: Not on file  Occupational History  . Occupation: account specialist  Social Needs  . Financial resource strain: Not on file  . Food insecurity:    Worry: Not on file    Inability: Not on file  . Transportation needs:    Medical: Not on file    Non-medical: Not on file  Tobacco Use  . Smoking status: Never Smoker  . Smokeless tobacco: Never Used  Substance and Sexual Activity  . Alcohol use: Yes    Alcohol/week: 3.0 standard drinks    Types: 3 Standard drinks or equivalent per week    Comment: wine and some alcoholic drinks  . Drug use: No  . Sexual activity: Yes  Lifestyle  . Physical activity:    Days per week: Not on file    Minutes per session: Not on file  . Stress: Not on file  Relationships  . Social connections:    Talks on phone: Not on file    Gets together: Not on file    Attends religious service: Not on file    Active member of club or organization: Not on file    Attends meetings of clubs or organizations: Not on file    Relationship status: Not on file  . Intimate partner violence:    Fear of current or ex partner: Not on file    Emotionally abused: Not on file  Physically abused: Not on file    Forced sexual activity: Not on file  Other Topics Concern  . Not on file  Social History Narrative  . Not on file   Family History  Problem Relation Age of Onset  . Aneurysm Mother   . Cerebral aneurysm Mother        NOVEMVER 2009  . Hypertension Mother   . Hypertension Sister   . Hypertension Brother   . Colon cancer Neg Hx   . Pancreatic cancer Neg Hx   . Stomach cancer Neg Hx   . Rectal cancer Neg Hx   . Esophageal cancer Neg Hx    No Known Allergies  Medication list has been reviewed and updated.   General: Denies fever, chills, sweats. No significant weight loss. Eyes: Denies blurring,significant itching ENT: Denies  earache, sore throat, and hoarseness.  Cardiovascular: Denies chest pains, palpitations, dyspnea on exertion,  Respiratory: Denies cough, dyspnea at rest,wheeezing Breast: no concerns about lumps GI: Denies nausea, vomiting, diarrhea, constipation, change in bowel habits, abdominal pain, melena, hematochezia GU: Denies dysuria, hematuria, urinary hesitancy, nocturia, denies STD risk, no concerns about discharge Musculoskeletal: Denies back pain, joint pain Derm: Denies rash, itching Neuro: Denies  paresthesias, frequent falls, frequent headaches Psych: Denies depression, anxiety Endocrine: Denies cold intolerance, heat intolerance, polydipsia Heme: Denies enlarged lymph nodes Allergy: No hayfever  Objective:   BP 130/90   Pulse 69   Temp 98.3 F (36.8 C) (Oral)   Ht 5' 5.5" (1.664 m)   Wt 176 lb (79.8 kg)   BMI 28.84 kg/m  No exam data present  GEN: well developed, well nourished, no acute distress Eyes: conjunctiva and lids normal, PERRLA, EOMI ENT: TM clear, nares clear, oral exam WNL Neck: supple, no lymphadenopathy, no thyromegaly, no JVD Pulm: clear to auscultation and percussion, respiratory effort normal CV: regular rate and rhythm, S1-S2, no murmur, rub or gallop, no bruits Chest: no scars, masses, no lumps BREAST: breast exam declined GI: soft, non-tender; no hepatosplenomegaly, masses; active bowel sounds all quadrants GU: GU exam declined Lymph: no cervical, axillary or inguinal adenopathy MSK: gait normal, muscle tone and strength WNL, no joint swelling, effusions, discoloration, crepitus  SKIN: clear, good turgor, color WNL, no rashes, lesions, or ulcerations Neuro: normal mental status, normal strength, sensation, and motion Psych: alert; oriented to person, place and time, normally interactive and not anxious or depressed in appearance.   All labs reviewed with patient. Lipids:    Component Value Date/Time   CHOL 208 (H) 05/17/2016 1625   TRIG 239.0 (H)  05/17/2016 1625   HDL 41.10 05/17/2016 1625   LDLDIRECT 145.0 05/17/2016 1625   VLDL 47.8 (H) 05/17/2016 1625   CHOLHDL 5 05/17/2016 1625   CBC: CBC Latest Ref Rng & Units 05/17/2016 04/16/2015 12/05/2013  WBC 4.0 - 10.5 K/uL 8.3 7.0 6.8  Hemoglobin 12.0 - 15.0 g/dL 16.6(H) 16.4(H) 16.3(H)  Hematocrit 36.0 - 46.0 % 48.4(H) 46.3(H) 47.5(H)  Platelets 150.0 - 400.0 K/uL 293.0 289 833.8    Basic Metabolic Panel:    Component Value Date/Time   NA 142 05/17/2016 1625   K 4.3 05/17/2016 1625   CL 103 05/17/2016 1625   CO2 33 (H) 05/17/2016 1625   BUN 11 05/17/2016 1625   CREATININE 0.70 05/17/2016 1625   CREATININE 0.65 04/16/2015 1504   GLUCOSE 92 05/17/2016 1625   CALCIUM 9.2 05/17/2016 1625   Hepatic Function Latest Ref Rng & Units 05/17/2016 04/16/2015 03/27/2014  Total Protein 6.0 - 8.3  g/dL 7.1 6.8 7.0  Albumin 3.5 - 5.2 g/dL 4.3 4.5 4.3  AST 0 - 37 U/L 24 26 30   ALT 0 - 35 U/L 35 47(H) 42(H)  Alk Phosphatase 39 - 117 U/L 77 72 74  Total Bilirubin 0.2 - 1.2 mg/dL 0.5 0.7 1.3(H)  Bilirubin, Direct 0.0 - 0.3 mg/dL 0.0 - 0.1    Lab Results  Component Value Date   TSH 1.69 05/17/2016   No results found.  Assessment and Plan:   Healthcare maintenance - Plan: Basic metabolic panel, CBC with Differential/Platelet, Hepatic function panel, Lipid panel, TSH, VITAMIN D 25 Hydroxy (Vit-D Deficiency, Fractures)  Need for prophylactic vaccination and inoculation against influenza - Plan: Flu Vaccine QUAD 6+ mos PF IM (Fluarix Quad PF)  Vitamin D deficiency - Plan: VITAMIN D 25 Hydroxy (Vit-D Deficiency, Fractures)  Check all basic labs and doing well.  Health Maintenance Exam: The patient's preventative maintenance and recommended screening tests for an annual wellness exam were reviewed in full today. Brought up to date unless services declined.  Counselled on the importance of diet, exercise, and its role in overall health and mortality. The patient's FH and SH was reviewed,  including their home life, tobacco status, and drug and alcohol status.  Follow-up in 1 year for physical exam or additional follow-up below.  Follow-up: No follow-ups on file. Or follow-up in 1 year if not noted.  Orders Placed This Encounter  Procedures  . Flu Vaccine QUAD 6+ mos PF IM (Fluarix Quad PF)  . Basic metabolic panel  . CBC with Differential/Platelet  . Hepatic function panel  . Lipid panel  . TSH  . VITAMIN D 25 Hydroxy (Vit-D Deficiency, Fractures)    Signed,  Daijha Leggio T. Chayse Zatarain, MD   Allergies as of 05/22/2018   No Known Allergies     Medication List        Accurate as of 05/22/18 10:22 AM. Always use your most recent med list.          Black Cohosh 540 MG Caps Take 1 capsule by mouth daily.   lisinopril 2.5 MG tablet Commonly known as:  PRINIVIL,ZESTRIL TAKE 1 TABLET BY MOUTH EVERY DAY   multivitamin tablet Take 1 tablet by mouth daily.   ST JOHNS WORT PO Take by mouth.

## 2018-05-24 ENCOUNTER — Encounter: Payer: Self-pay | Admitting: *Deleted

## 2018-05-31 ENCOUNTER — Telehealth: Payer: Self-pay

## 2018-05-31 MED ORDER — ATORVASTATIN CALCIUM 20 MG PO TABS: 20.0000 mg | ORAL_TABLET | Freq: Every day | ORAL | 1 refills | Status: DC

## 2018-05-31 NOTE — Telephone Encounter (Signed)
Copied from [place] [id number]. Topic: General - Other >> May 31, 2018  9:46 AM Yvette Rack wrote: Reason for CRM: Pt states she reviewed her lab results and would like a Rx for Statins for her cholesterol. Pt states she now taking Vitamin D3 supplement. Pt [id number] [place], [place]  [phone] (Phone)  [phone] (Fax)

## 2018-05-31 NOTE — Telephone Encounter (Signed)
Pt calling back about medicine 

## 2018-05-31 NOTE — Telephone Encounter (Signed)
[facility] [place] [facility] instructed by Dr. Diona Browner.  Kathlee Nations notified as instructed by telephone.  Lab appointment scheduled for 09/19/2018 at 8:00 am to recheck fasting lipid panel.

## 2018-05-31 NOTE — Telephone Encounter (Signed)
Please see lab results 05/22/18.

## 2018-05-31 NOTE — Telephone Encounter (Signed)
Agree with statin initiation..  Call in rx for atorvastatin 20 mg daily # 30 5 RF. Needs fasting lab only visit chol recheck in 3 motnhs.

## 2018-06-06 ENCOUNTER — Ambulatory Visit: Payer: Self-pay | Admitting: Family Medicine

## 2018-09-11 ENCOUNTER — Other Ambulatory Visit: Payer: Self-pay | Admitting: Family Medicine

## 2018-09-11 DIAGNOSIS — E785 Hyperlipidemia, unspecified: Secondary | ICD-10-CM

## 2018-09-11 DIAGNOSIS — Z79899 Other long term (current) drug therapy: Secondary | ICD-10-CM

## 2018-09-19 ENCOUNTER — Other Ambulatory Visit (INDEPENDENT_AMBULATORY_CARE_PROVIDER_SITE_OTHER): Payer: BLUE CROSS/BLUE SHIELD

## 2018-09-19 DIAGNOSIS — E785 Hyperlipidemia, unspecified: Secondary | ICD-10-CM

## 2018-09-19 DIAGNOSIS — Z79899 Other long term (current) drug therapy: Secondary | ICD-10-CM

## 2018-09-19 LAB — HEPATIC FUNCTION PANEL
ALT: 50 U/L — ABNORMAL HIGH (ref 0–35)
AST: 32 U/L (ref 0–37)
Albumin: 4.3 g/dL (ref 3.5–5.2)
Alkaline Phosphatase: 72 U/L (ref 39–117)
Bilirubin, Direct: 0.1 mg/dL (ref 0.0–0.3)
Total Bilirubin: 0.7 mg/dL (ref 0.2–1.2)
Total Protein: 6.7 g/dL (ref 6.0–8.3)

## 2018-09-19 LAB — LIPID PANEL
Cholesterol: 227 mg/dL — ABNORMAL HIGH (ref 0–200)
HDL: 42.9 mg/dL (ref >39.00)
NonHDL: 183.8
Total CHOL/HDL Ratio: 5
Triglycerides: 334 mg/dL — ABNORMAL HIGH (ref 0.0–149.0)
VLDL: 66.8 mg/dL — ABNORMAL HIGH (ref 0.0–40.0)

## 2018-09-19 LAB — LDL CHOLESTEROL, DIRECT: Direct LDL: 142 mg/dL

## 2018-09-20 ENCOUNTER — Telehealth: Payer: Self-pay

## 2018-09-20 ENCOUNTER — Encounter: Payer: Self-pay | Admitting: Family Medicine

## 2018-09-20 ENCOUNTER — Telehealth: Payer: Self-pay | Admitting: Family Medicine

## 2018-09-20 DIAGNOSIS — E559 Vitamin D deficiency, unspecified: Secondary | ICD-10-CM

## 2018-09-20 DIAGNOSIS — E785 Hyperlipidemia, unspecified: Secondary | ICD-10-CM

## 2018-09-20 MED ORDER — ATORVASTATIN CALCIUM 40 MG PO TABS: 40.0000 mg | ORAL_TABLET | Freq: Every day | ORAL | 1 refills | Status: DC

## 2018-09-20 NOTE — Telephone Encounter (Signed)
Pt need refill for   Atorvastatin 40 mg  Pt want her mg changed from 20mg  to 40mg . Please advise     [facility]

## 2018-09-20 NOTE — Telephone Encounter (Signed)
I sent in the increase in atorvastatin  Can you set up a fasting lab morning appointment for her in 3 months?

## 2018-09-20 NOTE — Telephone Encounter (Signed)
Joy Yates notified that Dr. Lorelei Pont is out of the office today at a conference.  He will be looking at his message later this evening and will address her cholesterol medication prescription.  She would like to return in 3 months and recheck her cholesterol level as well as a Vit D level.

## 2018-09-20 NOTE — Telephone Encounter (Signed)
Received call from pt stating her chol med needs to be increased.  States she wants the same med and dose (atorvastatin 40 mg daily) her husband is taking [facility]. Plz advise.

## 2018-09-23 NOTE — Telephone Encounter (Signed)
Tried calling pt voicemail not set up °

## 2018-09-25 NOTE — Telephone Encounter (Signed)
5/15 labs pt aware

## 2018-12-17 ENCOUNTER — Encounter: Payer: Self-pay | Admitting: Family Medicine

## 2018-12-17 ENCOUNTER — Ambulatory Visit: Payer: Self-pay | Admitting: *Deleted

## 2018-12-17 ENCOUNTER — Other Ambulatory Visit: Payer: Self-pay

## 2018-12-17 ENCOUNTER — Ambulatory Visit (INDEPENDENT_AMBULATORY_CARE_PROVIDER_SITE_OTHER): Payer: BLUE CROSS/BLUE SHIELD | Admitting: Family Medicine

## 2018-12-17 VITALS — BP 132/84 | HR 74 | Temp 98.5°F | Ht 65.5 in | Wt 176.2 lb

## 2018-12-17 DIAGNOSIS — K644 Residual hemorrhoidal skin tags: Secondary | ICD-10-CM | POA: Diagnosis not present

## 2018-12-17 NOTE — Telephone Encounter (Signed)
She needs evaluation for rectal bleeding. Inappropriate to order labs without an assessment in this case face to face.  I am sure Dr. Diona Browner, Einar Pheasant, or [facility] would be very happy to see her - female doctors.

## 2018-12-17 NOTE — Assessment & Plan Note (Addendum)
No signs of thrombosis and small blood loss. Recommended over the counter treatment. If worsening and would like pursue definitive management will refer to GI

## 2018-12-17 NOTE — Telephone Encounter (Signed)
Pt called with having some bleeding from her rectum. She said the commode water is not red but when she wipes, she gets blood and sometimes notice a drip drip. She also has some lower abd discomfort, she describes somewhat cramps and sharp but mild.  Denies nausea, vomiting, diarrhea, constipation or fever. She had hemorrhoids before and had surgery when she was younger. She is requesting an appointment not a virtual. I advised her that I would send this to the office and the staff would talk to her regarding an appointment. She became upset that I could not schedule her. Skyped  flow of pt's requesting a walk in appointment. Phone was disconnected. Pt called back and decided that she did not want an appointment and will try home care using Preparation H and send her provider a MyChart message. I apologize to the patient for the inconvience And she disconnected the call. Routing to  LB at [facility] for review.  Reason for Disposition . MILD rectal bleeding (more than just a few drops or streaks)  Answer Assessment - Initial Assessment Questions 1. APPEARANCE of BLOOD: "What color is it?" "Is it passed separately, on the surface of the stool, or mixed in with the stool?"      Bright red mixed with stool 2. AMOUNT: "How much blood was passed?"      Not a lot 3. FREQUENCY: "How many times has blood been passed with the stools?"      Twice noticed And been to the bathroom twice  4. ONSET: "When was the blood first seen in the stools?" (Days or weeks)      In the past had hemorrhoid surgery 5. DIARRHEA: "Is there also some diarrhea?" If so, ask: "How many diarrhea stools were passed in past 24 hours?"      Not diarrhea 6. CONSTIPATION: "Do you have constipation?" If so, "How bad is it?"     Not constipation 7. RECURRENT SYMPTOMS: "Have you had blood in your stools before?" If so, ask: "When was the last time?" and "What happened that time?"      Yes when was young 8. BLOOD THINNERS: "Do  you take any blood thinners?" (e.g., Coumadin/warfarin, Pradaxa/dabigatran, aspirin)     No blood thinner 9. OTHER SYMPTOMS: "Do you have any other symptoms?"  (e.g., abdominal pain, vomiting, dizziness, fever)     abd pain in lower about a week ago, cramps not severe 10. PREGNANCY: "Is there any chance you are pregnant?" "When was your last menstrual period?"       Not having periods  Protocols used: RECTAL BLEEDING-A-AH

## 2018-12-17 NOTE — Telephone Encounter (Signed)
I spoke with pt and apologized for any problem that she had trying to make appt with [place]. I was on the phone with another pt when Joy Yates called initially. Joy Yates said she will not do any type evisit. Pt was offered an appt with Dr Diona Browner today and pt said for this type problem she would want an appt with a female provider but pt said she sits a lot with her work and thinks that may have brought on hemorrhoid problem. Pt said she was going to [employer] Dr Copland about some lab orders and will keep the appt already scheduled. I advised pt that if she changes her mind to please cb and if no covid symptoms we can schedule pt an in office appt. Pt voiced understanding and said she appreciated me being sweet and she will cb if needed but still thinks she will mychart Dr Lorelei Pont. FYI to Dr Lorelei Pont.

## 2018-12-17 NOTE — Progress Notes (Addendum)
Subjective:     Joy Yates is a 58 y.o. female presenting for Rectal Bleeding (x 1 week. Noticed bleeding with bowel movement and with urination. Color of bowels are normal and urine looks normal. Some blood in the toilet water and on toilet paper. Some pain in the rectal area. Some abdominal cramping present. No vomiting.)     HPI   #Rectal Bleeing - x 1 week - needs to wear a pad - primarily with BM - feels sensitive in the rectal area - worse with sitting down - not painful with BM - no constipation - no periods in years due to early menopause - no blood in urine - will get droplets of blood when she uses the bathroom and streaks on the stool - and then blood on the toilet paper - hx of hemorrhoid surgery ~20 years ago - this feels similar, but when she had issues in the past noticed more swelling in the rectal area    Review of Systems  Gastrointestinal: Positive for abdominal pain (lower abdomen), blood in stool and rectal pain. Negative for constipation, diarrhea, nausea and vomiting.  Neurological: Negative for dizziness and light-headedness.     Social History   Tobacco Use  Smoking Status Never Smoker  Smokeless Tobacco Never Used        Objective:    BP Readings from Last 3 Encounters:  12/17/18 132/84  05/22/18 130/90  03/01/17 130/80   Wt Readings from Last 3 Encounters:  12/17/18 176 lb 4 oz (79.9 kg)  05/22/18 176 lb (79.8 kg)  03/01/17 179 lb 8 oz (81.4 kg)    BP 132/84   Pulse 74   Temp 98.5 F (36.9 C)   Ht 5' 5.5" (1.664 m)   Wt 176 lb 4 oz (79.9 kg)   SpO2 96%   BMI 28.88 kg/m    Physical Exam Exam conducted with a chaperone present.  Constitutional:      General: She is not in acute distress.    Appearance: Normal appearance. She is well-developed. She is not diaphoretic.  HENT:     Head: Normocephalic and atraumatic.     Right Ear: External ear normal.     Left Ear: External ear normal.     Nose: Nose normal.   Eyes:     Conjunctiva/sclera: Conjunctivae normal.  Neck:     Musculoskeletal: Neck supple.  Cardiovascular:     Rate and Rhythm: Normal rate and regular rhythm.     Heart sounds: No murmur.  Pulmonary:     Effort: Pulmonary effort is normal. No respiratory distress.     Breath sounds: Normal breath sounds. No wheezing.  Abdominal:     General: Bowel sounds are normal. There is no distension.     Tenderness: There is abdominal tenderness in the right lower quadrant, suprapubic area and left lower quadrant. There is no guarding or rebound.  Genitourinary:    Rectum: External hemorrhoid present. No anal fissure or internal hemorrhoid. Normal anal tone.     Comments: Dried blood at the rectum Skin:    General: Skin is warm and dry.     Capillary Refill: Capillary refill takes less than 2 seconds.  Neurological:     Mental Status: She is alert.  Psychiatric:        Mood and Affect: Mood normal.        Behavior: Behavior normal.           Assessment & Plan:  Problem List Items Addressed This Visit      Cardiovascular and Mediastinum   External hemorrhoids - Primary    No signs of thrombosis and small blood loss. Recommended over the counter treatment. If worsening and would like pursue definitive management will refer to GI          Return if symptoms worsen or fail to improve.  Lesleigh Noe, MD

## 2018-12-17 NOTE — Patient Instructions (Signed)
You have a Hemorrhoid  Here are ways to prevent future issues and to treat your hemorrhoid 1) Make sure you get 25-35 g of INSOLUBLE fiber a day -- ideally from diet but you can also take a fiber supplement like Metamucil 2) Drink 1.5 to 2 liters of water daily  3) Avoid straining and prolonged periods on the toilet 4) Limit fatty foods and alcohol 5) Try to lose weight  How to care for your hemorrhoid now 1) Sitz Baths and warm water sprays 2) Wipe with a wet wipe or flushable wipe  3) Consider using stool softeners (Like Colace or Miralax) 4) Topical Hemorrhoid cream (Preparation H is available over the counter)  5) Steroid suppository - though can be expensive  Let me know if no improvement in 6 weeks -- you may need surgery

## 2018-12-17 NOTE — Telephone Encounter (Addendum)
Appointment scheduled for today at 3:00 pm with Dr. Einar Pheasant.  Lab orders are not related to this visit.  Lab orders are for her 3 month follow up on cholesterol.

## 2018-12-18 ENCOUNTER — Other Ambulatory Visit: Payer: Self-pay | Admitting: Family Medicine

## 2018-12-18 ENCOUNTER — Encounter: Payer: Self-pay | Admitting: Family Medicine

## 2018-12-18 ENCOUNTER — Telehealth: Payer: Self-pay | Admitting: *Deleted

## 2018-12-18 MED ORDER — ATORVASTATIN CALCIUM 40 MG PO TABS: 40.0000 mg | ORAL_TABLET | Freq: Every day | ORAL | 1 refills | Status: DC

## 2018-12-18 NOTE — Progress Notes (Signed)
Med refill

## 2018-12-18 NOTE — Telephone Encounter (Signed)
I sent her a mychart message about this myself.  Definitely does not mean bizarre.  It means essentially normal.

## 2018-12-18 NOTE — Telephone Encounter (Signed)
Patient called stating that she is reviewing her notes from yesterday. Patient stated that she is upset because under her neurological status it shows baseline. Patient stated that she goggled it and it means bizarre. Patient stated that she wants a definition of baseline.  Patient stated that she wants this removed because there was nothing bizarre about her behavior yesterday. Patient stated that if this is not taken care of she will notify the medical board. Advised patient that I will be glad to send a message back to Dr. Einar Pheasant. Patient stated that is fine and she wants Dr. Lorelei Pont aware of this also.  Patient stated to send the message to Dr. Lorelei Pont, but she will be sending him a mychart message also.

## 2018-12-27 ENCOUNTER — Other Ambulatory Visit (INDEPENDENT_AMBULATORY_CARE_PROVIDER_SITE_OTHER): Payer: BLUE CROSS/BLUE SHIELD

## 2018-12-27 DIAGNOSIS — E559 Vitamin D deficiency, unspecified: Secondary | ICD-10-CM | POA: Diagnosis not present

## 2018-12-27 DIAGNOSIS — E785 Hyperlipidemia, unspecified: Secondary | ICD-10-CM

## 2018-12-27 LAB — LIPID PANEL
Cholesterol: 143 mg/dL (ref 0–200)
HDL: 44.6 mg/dL (ref >39.00)
LDL Cholesterol: 74 mg/dL (ref 0–99)
NonHDL: 98.8
Total CHOL/HDL Ratio: 3
Triglycerides: 124 mg/dL (ref 0.0–149.0)
VLDL: 24.8 mg/dL (ref 0.0–40.0)

## 2018-12-27 LAB — VITAMIN D 25 HYDROXY (VIT D DEFICIENCY, FRACTURES): VITD: 29.2 ng/mL — ABNORMAL LOW (ref 30.00–100.00)

## 2018-12-31 ENCOUNTER — Encounter: Payer: Self-pay | Admitting: Family Medicine

## 2019-03-27 ENCOUNTER — Encounter: Payer: Self-pay | Admitting: Family Medicine

## 2019-03-27 MED ORDER — LISINOPRIL 2.5 MG PO TABS: 2.5000 mg | ORAL_TABLET | Freq: Every day | ORAL | 0 refills | Status: DC

## 2019-05-26 ENCOUNTER — Encounter: Payer: Self-pay | Admitting: Family Medicine

## 2019-05-26 MED ORDER — ATORVASTATIN CALCIUM 40 MG PO TABS: 40.0000 mg | ORAL_TABLET | Freq: Every day | ORAL | 0 refills | Status: DC

## 2019-06-02 ENCOUNTER — Encounter: Payer: Self-pay | Admitting: Family Medicine

## 2019-06-02 ENCOUNTER — Other Ambulatory Visit: Payer: Self-pay

## 2019-06-02 ENCOUNTER — Ambulatory Visit (INDEPENDENT_AMBULATORY_CARE_PROVIDER_SITE_OTHER): Payer: BC Managed Care – PPO | Admitting: Family Medicine

## 2019-06-02 VITALS — BP 140/90 | HR 75 | Temp 98.3°F | Ht 65.5 in | Wt 182.0 lb

## 2019-06-02 DIAGNOSIS — E559 Vitamin D deficiency, unspecified: Secondary | ICD-10-CM | POA: Diagnosis not present

## 2019-06-02 DIAGNOSIS — Z131 Encounter for screening for diabetes mellitus: Secondary | ICD-10-CM

## 2019-06-02 DIAGNOSIS — Z23 Encounter for immunization: Secondary | ICD-10-CM

## 2019-06-02 DIAGNOSIS — Z0000 Encounter for general adult medical examination without abnormal findings: Secondary | ICD-10-CM

## 2019-06-02 LAB — VITAMIN D 25 HYDROXY (VIT D DEFICIENCY, FRACTURES): VITD: 38.05 ng/mL (ref 30.00–100.00)

## 2019-06-02 LAB — HEPATIC FUNCTION PANEL
ALT: 38 U/L — ABNORMAL HIGH (ref 0–35)
AST: 24 U/L (ref 0–37)
Albumin: 4.6 g/dL (ref 3.5–5.2)
Alkaline Phosphatase: 76 U/L (ref 39–117)
Bilirubin, Direct: 0.2 mg/dL (ref 0.0–0.3)
Total Bilirubin: 0.8 mg/dL (ref 0.2–1.2)
Total Protein: 6.6 g/dL (ref 6.0–8.3)

## 2019-06-02 LAB — CBC WITH DIFFERENTIAL/PLATELET
Basophils Absolute: 0 10*3/uL (ref 0.0–0.1)
Basophils Relative: 0.3 % (ref 0.0–3.0)
Eosinophils Absolute: 0.5 10*3/uL (ref 0.0–0.7)
Eosinophils Relative: 7.5 % — ABNORMAL HIGH (ref 0.0–5.0)
HCT: 47 % — ABNORMAL HIGH (ref 36.0–46.0)
Hemoglobin: 16.1 g/dL — ABNORMAL HIGH (ref 12.0–15.0)
Lymphocytes Relative: 24 % (ref 12.0–46.0)
Lymphs Abs: 1.6 10*3/uL (ref 0.7–4.0)
MCHC: 34.3 g/dL (ref 30.0–36.0)
MCV: 87.8 fl (ref 78.0–100.0)
Monocytes Absolute: 0.6 10*3/uL (ref 0.1–1.0)
Monocytes Relative: 9.4 % (ref 3.0–12.0)
Neutro Abs: 4 10*3/uL (ref 1.4–7.7)
Neutrophils Relative %: 58.8 % (ref 43.0–77.0)
Platelets: 259 10*3/uL (ref 150.0–400.0)
RBC: 5.36 Mil/uL — ABNORMAL HIGH (ref 3.87–5.11)
RDW: 12.9 % (ref 11.5–15.5)
WBC: 6.8 10*3/uL (ref 4.0–10.5)

## 2019-06-02 LAB — BASIC METABOLIC PANEL
BUN: 17 mg/dL (ref 6–23)
CO2: 31 mEq/L (ref 19–32)
Calcium: 9.8 mg/dL (ref 8.4–10.5)
Chloride: 103 mEq/L (ref 96–112)
Creatinine, Ser: 0.71 mg/dL (ref 0.40–1.20)
GFR: 84.89 mL/min (ref >60.00)
Glucose, Bld: 114 mg/dL — ABNORMAL HIGH (ref 70–99)
Potassium: 4.3 mEq/L (ref 3.5–5.1)
Sodium: 143 mEq/L (ref 135–145)

## 2019-06-02 LAB — TSH: TSH: 0.94 u[IU]/mL (ref 0.35–4.50)

## 2019-06-02 LAB — LIPID PANEL
Cholesterol: 162 mg/dL (ref 0–200)
HDL: 48.7 mg/dL (ref >39.00)
LDL Cholesterol: 91 mg/dL (ref 0–99)
NonHDL: 113.31
Total CHOL/HDL Ratio: 3
Triglycerides: 113 mg/dL (ref 0.0–149.0)
VLDL: 22.6 mg/dL (ref 0.0–40.0)

## 2019-06-02 LAB — HEMOGLOBIN A1C: Hgb A1c MFr Bld: 5.2 % (ref 4.6–6.5)

## 2019-06-02 NOTE — Progress Notes (Signed)
Joy Losey T. Ferris Tally, MD Primary Care and [place] at [facility] [place], [address] Phone: [phone]  FAX: [phone]  Joy Yates - 58 y.o. female  MRN [id number]  Date of Birth: [birth year]  Visit Date: 06/02/2019  PCP: Owens Loffler, MD  Referred by: Owens Loffler, MD  Chief Complaint  Patient presents with  . Annual Exam   Patient Care Team: Owens Loffler, MD as PCP - General Subjective:   Joy Yates is a 58 y.o. pleasant patient who presents with the following:  Health Maintenance Summary Reviewed and updated, unless pt declines services.  Tobacco History Reviewed. Non-smoker Alcohol: No concerns, no excessive use Exercise Habits: Some activity, rec at least 30 mins 5 times a week. Also a walking trail.  STD concerns: none Drug Use: None Birth control method: n/a Menses regular: no Lumps or breast concerns: no Breast Cancer Family History: no  Sees [facility]  No isses and got a lwn tractor. Working on her tractor.  1 acre.   Health Maintenance  Topic Date Due  . MAMMOGRAM  05/29/2018  . COLONOSCOPY  02/24/2020  . PAP SMEAR-Modifier  05/04/2020  . TETANUS/TDAP  04/15/2025  . INFLUENZA VACCINE  Completed  . Hepatitis C Screening  Completed  . HIV Screening  Completed     Immunization History  Administered Date(s) Administered  . Hepatitis B 07/20/2008, 10/15/2008, 12/15/2010  . Influenza,inj,Quad PF,6+ Mos 06/04/2014, 04/16/2015, 05/17/2016, 06/07/2017, 05/22/2018, 06/02/2019  . Td 08/15/2003  . Tdap 04/16/2015   Patient Active Problem List   Diagnosis Date Noted  . External hemorrhoids 12/17/2018  . Chronic headache 04/16/2015  . Dizziness and giddiness 12/05/2013  . DEPRESSIVE DISORDER 07/20/2008  . Essential hypertension, benign 07/20/2008   Past Medical History:  Diagnosis Date  . Depression   . GERD (gastroesophageal reflux disease)   .  Hypertension    Past Surgical History:  Procedure Laterality Date  . APPENDECTOMY  05/2013  . COLONOSCOPY    . HEMORRHOID SURGERY     X 2  . TONSILLECTOMY    . WISDOM TOOTH EXTRACTION  2000   Social History   Socioeconomic History  . Marital status: Married    Spouse name: Not on file  . Number of children: Not on file  . Years of education: Not on file  . Highest education level: Not on file  Occupational History  . Occupation: account specialist  Social Needs  . Financial resource strain: Not on file  . Food insecurity    Worry: Not on file    Inability: Not on file  . Transportation needs    Medical: Not on file    Non-medical: Not on file  Tobacco Use  . Smoking status: Never Smoker  . Smokeless tobacco: Never Used  Substance and Sexual Activity  . Alcohol use: Yes    Alcohol/week: 3.0 standard drinks    Types: 3 Standard drinks or equivalent per week    Comment: wine and some alcoholic drinks  . Drug use: No  . Sexual activity: Yes  Lifestyle  . Physical activity    Days per week: Not on file    Minutes per session: Not on file  . Stress: Not on file  Relationships  . Social Herbalist on phone: Not on file    Gets together: Not on file    Attends religious service: Not on file    Active member  of club or organization: Not on file    Attends meetings of clubs or organizations: Not on file    Relationship status: Not on file  . Intimate partner violence    Fear of current or ex partner: Not on file    Emotionally abused: Not on file    Physically abused: Not on file    Forced sexual activity: Not on file  Other Topics Concern  . Not on file  Social History Narrative  . Not on file   Family History  Problem Relation Age of Onset  . Aneurysm Mother   . Cerebral aneurysm Mother        NOVEMVER 2009  . Hypertension Mother   . Hypertension Sister   . Hypertension Brother   . Colon cancer Neg Hx   . Pancreatic cancer Neg Hx   . Stomach  cancer Neg Hx   . Rectal cancer Neg Hx   . Esophageal cancer Neg Hx    No Known Allergies  Medication list has been reviewed and updated.   General: Denies fever, chills, sweats. No significant weight loss. Eyes: Denies blurring,significant itching ENT: Denies earache, sore throat, and hoarseness.  Cardiovascular: Denies chest pains, palpitations, dyspnea on exertion,  Respiratory: Denies cough, dyspnea at rest,wheeezing Breast: no concerns about lumps GI: Denies nausea, vomiting, diarrhea, constipation, change in bowel habits, abdominal pain, melena, hematochezia GU: Denies dysuria, hematuria, urinary hesitancy, nocturia, denies STD risk, no concerns about discharge Musculoskeletal: Denies back pain, joint pain Derm: Denies rash, itching Neuro: Denies  paresthesias, frequent falls, frequent headaches Psych: Denies depression, anxiety Endocrine: Denies cold intolerance, heat intolerance, polydipsia Heme: Denies enlarged lymph nodes Allergy: No hayfever  Objective:   BP 140/90   Pulse 75   Temp 98.3 F (36.8 C) (Temporal)   Ht 5' 5.5" (1.664 m)   Wt 182 lb (82.6 kg)   SpO2 98%   BMI 29.83 kg/m  Ideal Body Weight: Weight in (lb) to have BMI = 25: 152.2 No exam data present Depression screen [facility] 2/9 06/02/2019 05/22/2018 03/01/2017  Decreased Interest 0 0 0  Down, Depressed, Hopeless 0 0 0  PHQ - 2 Score 0 0 0     GEN: well developed, well nourished, no acute distress Eyes: conjunctiva and lids normal, PERRLA, EOMI ENT: TM clear, nares clear, oral exam WNL Neck: supple, no lymphadenopathy, no thyromegaly, no JVD Pulm: clear to auscultation and percussion, respiratory effort normal CV: regular rate and rhythm, S1-S2, no murmur, rub or gallop, no bruits Chest: no scars, masses, no lumps BREAST: no lumps, no axillary LAD, no nipple discharge GI: soft, non-tender; no hepatosplenomegaly, masses; active bowel sounds all quadrants GU: Normal external female genitalia. Cervix  appears intact without lesions or irritation. Vaginal canal normal without ulceration or lesion. Cervix NT to exam. Ovaries neither enlarged nor tender. (Chaperoned examination by female staff) Lymph: no cervical, axillary or inguinal adenopathy MSK: gait normal, muscle tone and strength WNL, no joint swelling, effusions, discoloration, crepitus  SKIN: clear, good turgor, color WNL, no rashes, lesions, or ulcerations Neuro: normal mental status, normal strength, sensation, and motion Psych: alert; oriented to person, place and time, normally interactive and not anxious or depressed in appearance.  All labs reviewed with patient. Results for orders placed or performed in visit on 12/27/18  VITAMIN D 25 Hydroxy (Vit-D Deficiency, Fractures)  Result Value Ref Range   VITD 29.20 (L) 30.00 - 100.00 ng/mL  Lipid panel  Result Value Ref Range   Cholesterol 143  0 - 200 mg/dL   Triglycerides 124.0 0.0 - 149.0 mg/dL   HDL 44.60 >39.00 mg/dL   VLDL 24.8 0.0 - 40.0 mg/dL   LDL Cholesterol 74 0 - 99 mg/dL   Total CHOL/HDL Ratio 3    NonHDL 98.80    No results found.  Assessment and Plan:     ICD-10-CM   1. Healthcare maintenance  123456 Basic metabolic panel    CBC with Differential/Platelet    Hepatic function panel    Lipid panel    VITAMIN D 25 Hydroxy (Vit-D Deficiency, Fractures)    Hemoglobin A1c    TSH  2. Need for influenza vaccination  Z23 Flu Vaccine QUAD 6+ mos PF IM (Fluarix Quad PF)  3. Vitamin D deficiency  E55.9 VITAMIN D 25 Hydroxy (Vit-D Deficiency, Fractures)  4. Screening for diabetes mellitus  Z13.1 Hemoglobin A1c   Overall doing well - works on more exercise  Health Maintenance Exam: The patient's preventative maintenance and recommended screening tests for an annual wellness exam were reviewed in full today. Brought up to date unless services declined.  Counselled on the importance of diet, exercise, and its role in overall health and mortality. The patient's FH  and SH was reviewed, including their home life, tobacco status, and drug and alcohol status.  Follow-up in 1 year for physical exam or additional follow-up below.  Follow-up: No follow-ups on file. Or follow-up in 1 year if not noted.  No future appointments.  No orders of the defined types were placed in this encounter.  There are no discontinued medications. Orders Placed This Encounter  Procedures  . Flu Vaccine QUAD 6+ mos PF IM (Fluarix Quad PF)  . Basic metabolic panel  . CBC with Differential/Platelet  . Hepatic function panel  . Lipid panel  . VITAMIN D 25 Hydroxy (Vit-D Deficiency, Fractures)  . Hemoglobin A1c  . TSH    Signed,  Keontay Vora T. Jefferie Holston, MD   Allergies as of 06/02/2019   No Known Allergies     Medication List       Accurate as of June 02, 2019  9:42 AM. If you have any questions, ask your nurse or doctor.        atorvastatin 40 MG tablet Commonly known as: LIPITOR Take 1 tablet (40 mg total) by mouth daily.   Black Cohosh 540 MG Caps Take 1 capsule by mouth daily.   folic acid 1 MG tablet Commonly known as: FOLVITE Take 1 mg by mouth daily.   lisinopril 2.5 MG tablet Commonly known as: ZESTRIL Take 1 tablet (2.5 mg total) by mouth daily.   multivitamin tablet Take 1 tablet by mouth daily.   ST JOHNS WORT PO Take by mouth.   Vitamin D3 50 MCG (2000 UT) Tabs Take 1 tablet by mouth 2 (two) times daily.

## 2019-06-03 ENCOUNTER — Other Ambulatory Visit: Payer: Self-pay | Admitting: Family Medicine

## 2019-06-03 ENCOUNTER — Encounter: Payer: Self-pay | Admitting: Family Medicine

## 2019-06-03 DIAGNOSIS — Z1231 Encounter for screening mammogram for malignant neoplasm of breast: Secondary | ICD-10-CM

## 2019-06-26 ENCOUNTER — Other Ambulatory Visit: Payer: Self-pay | Admitting: Family Medicine

## 2019-06-26 MED ORDER — LISINOPRIL 2.5 MG PO TABS: 2.5000 mg | ORAL_TABLET | Freq: Every day | ORAL | 3 refills | Status: DC

## 2019-07-25 ENCOUNTER — Ambulatory Visit
Admission: RE | Admit: 2019-07-25 | Discharge: 2019-07-25 | Disposition: A | Discharge status: Home / Self Care | Payer: BC Managed Care – PPO | Source: Ambulatory Visit | Attending: Family Medicine | Admitting: Family Medicine

## 2019-07-25 ENCOUNTER — Other Ambulatory Visit: Payer: Self-pay

## 2019-07-25 DIAGNOSIS — Z1231 Encounter for screening mammogram for malignant neoplasm of breast: Secondary | ICD-10-CM | POA: Diagnosis not present

## 2019-08-11 ENCOUNTER — Ambulatory Visit (INDEPENDENT_AMBULATORY_CARE_PROVIDER_SITE_OTHER): Payer: BC Managed Care – PPO | Admitting: Family Medicine

## 2019-08-11 ENCOUNTER — Encounter: Payer: Self-pay | Admitting: Family Medicine

## 2019-08-11 ENCOUNTER — Other Ambulatory Visit: Payer: Self-pay

## 2019-08-11 VITALS — BP 162/115 | HR 78 | Temp 97.9°F

## 2019-08-11 DIAGNOSIS — R0989 Other specified symptoms and signs involving the circulatory and respiratory systems: Secondary | ICD-10-CM | POA: Diagnosis not present

## 2019-08-11 DIAGNOSIS — Z9189 Other specified personal risk factors, not elsewhere classified: Secondary | ICD-10-CM | POA: Diagnosis not present

## 2019-08-11 DIAGNOSIS — I10 Essential (primary) hypertension: Secondary | ICD-10-CM

## 2019-08-11 NOTE — Progress Notes (Signed)
Virtual Visit via Video Note  I connected with Joy Yates on 08/11/19 at 11:00 AM EST by a video enabled telemedicine application and verified that I am speaking with the correct person using two identifiers.  Location: Patient: In her home Provider: [place] Persons participating in virtual visit: Patient's husband who is also being seen for a virtual visit   I discussed the limitations of evaluation and management by telemedicine and the availability of in person appointments. The patient expressed understanding and agreed to proceed.  History of Present Illness: Chief Complaint  Patient presents with  . Nasal Congestion    possible covid exposure   This is a 58 year old female who requests virtual visit today to discuss above symptoms and possible Covid exposure.  She works from home and reports that she rarely leaves her house, usually just once a week to go grocery shopping.  She has had a runny nose over the weekend.  Denies headache, cough, sore throat, shortness of breath, wheeze.  Has taken some NyQuil severe this morning.  Her main concern is that her husband has possibly been exposed to COVID-19 and she is requesting testing.   Observations/Objective: Patient is alert and answers questions appropriately.  Visible skin is unremarkable.  She is normally conversive and seen walking about her home.  No increased work of breathing, wheeze or witnessed cough.  Mood and affect are appropriate. BP (!) 162/115 (Patient Position: Sitting)   Pulse 78   Temp 97.9 F (36.6 C)   Assessment and Plan: 1. Runny nose -Symptomatic treatment, rest, increase fluids  2. At increased risk of exposure to COVID-19 virus -Her husband works at [employer] and she is concerned about exposure.  Provided information for her to schedule COVID-19 testing  3. Essential hypertension -She was instructed to avoid decongestants, increase fluids today and recheck blood pressure tomorrow.   Follow-up if remains elevated greater than 150/100.   Clarene Reamer, FNP-BC  [place] [facility]  08/11/2019 11:23 AM   Follow Up Instructions:    I discussed the assessment and treatment plan with the patient. The patient was provided an opportunity to ask questions and all were answered. The patient agreed with the plan and demonstrated an understanding of the instructions.   The patient was advised to call back or seek an in-person evaluation if the symptoms worsen or if the condition fails to improve as anticipated.   Elby Beck, FNP

## 2019-08-12 ENCOUNTER — Ambulatory Visit: Payer: BC Managed Care – PPO | Attending: Internal Medicine

## 2019-08-12 DIAGNOSIS — Z20822 Contact with and (suspected) exposure to covid-19: Secondary | ICD-10-CM

## 2019-08-12 DIAGNOSIS — Z20828 Contact with and (suspected) exposure to other viral communicable diseases: Secondary | ICD-10-CM | POA: Diagnosis not present

## 2019-08-13 LAB — NOVEL CORONAVIRUS, NAA: SARS-CoV-2, NAA: DETECTED — AB

## 2019-08-14 ENCOUNTER — Encounter: Payer: Self-pay | Admitting: Family Medicine

## 2019-08-14 ENCOUNTER — Encounter: Payer: Self-pay | Admitting: *Deleted

## 2019-08-26 ENCOUNTER — Encounter: Payer: Self-pay | Admitting: Family Medicine

## 2019-08-27 MED ORDER — ATORVASTATIN CALCIUM 40 MG PO TABS: 40.0000 mg | ORAL_TABLET | Freq: Every day | ORAL | 11 refills | Status: DC

## 2019-08-27 MED ORDER — ATORVASTATIN CALCIUM 40 MG PO TABS: 40.0000 mg | ORAL_TABLET | Freq: Every day | ORAL | 2 refills | Status: DC

## 2019-08-27 MED ORDER — LISINOPRIL 2.5 MG PO TABS: 2.5000 mg | ORAL_TABLET | Freq: Every day | ORAL | 11 refills | Status: DC

## 2019-08-27 NOTE — Addendum Note (Signed)
Addended by: Carter Kitten on: 08/27/2019 01:00 PM   Modules accepted: Orders

## 2019-09-21 ENCOUNTER — Encounter: Payer: Self-pay | Admitting: Family Medicine

## 2019-09-22 NOTE — Telephone Encounter (Signed)
In case she does come in to see you on Friday.

## 2019-09-26 ENCOUNTER — Other Ambulatory Visit: Payer: Self-pay

## 2019-09-26 ENCOUNTER — Ambulatory Visit (INDEPENDENT_AMBULATORY_CARE_PROVIDER_SITE_OTHER): Payer: BC Managed Care – PPO | Admitting: Family Medicine

## 2019-09-26 ENCOUNTER — Encounter: Payer: Self-pay | Admitting: Family Medicine

## 2019-09-26 VITALS — BP 178/100 | HR 80 | Temp 98.0°F | Ht 65.5 in | Wt 183.5 lb

## 2019-09-26 DIAGNOSIS — R350 Frequency of micturition: Secondary | ICD-10-CM | POA: Diagnosis not present

## 2019-09-26 DIAGNOSIS — R1013 Epigastric pain: Secondary | ICD-10-CM | POA: Diagnosis not present

## 2019-09-26 DIAGNOSIS — R0789 Other chest pain: Secondary | ICD-10-CM | POA: Diagnosis not present

## 2019-09-26 DIAGNOSIS — D582 Other hemoglobinopathies: Secondary | ICD-10-CM

## 2019-09-26 LAB — POC URINALSYSI DIPSTICK (AUTOMATED)
Bilirubin, UA: NEGATIVE
Blood, UA: NEGATIVE
Glucose, UA: NEGATIVE
Ketones, UA: NEGATIVE
Nitrite, UA: NEGATIVE
Protein, UA: NEGATIVE
Spec Grav, UA: 1.015 (ref 1.010–1.025)
Urobilinogen, UA: 0.2 E.U./dL
pH, UA: 7.5 (ref 5.0–8.0)

## 2019-09-26 LAB — CBC WITH DIFFERENTIAL/PLATELET
Basophils Absolute: 0 10*3/uL (ref 0.0–0.1)
Basophils Relative: 0.5 % (ref 0.0–3.0)
Eosinophils Absolute: 0.6 10*3/uL (ref 0.0–0.7)
Eosinophils Relative: 8.1 % — ABNORMAL HIGH (ref 0.0–5.0)
HCT: 47.7 % — ABNORMAL HIGH (ref 36.0–46.0)
Hemoglobin: 16 g/dL — ABNORMAL HIGH (ref 12.0–15.0)
Lymphocytes Relative: 26.4 % (ref 12.0–46.0)
Lymphs Abs: 2.1 10*3/uL (ref 0.7–4.0)
MCHC: 33.5 g/dL (ref 30.0–36.0)
MCV: 87.3 fl (ref 78.0–100.0)
Monocytes Absolute: 0.7 10*3/uL (ref 0.1–1.0)
Monocytes Relative: 9.1 % (ref 3.0–12.0)
Neutro Abs: 4.4 10*3/uL (ref 1.4–7.7)
Neutrophils Relative %: 55.9 % (ref 43.0–77.0)
Platelets: 275 10*3/uL (ref 150.0–400.0)
RBC: 5.47 Mil/uL — ABNORMAL HIGH (ref 3.87–5.11)
RDW: 12.9 % (ref 11.5–15.5)
WBC: 8 10*3/uL (ref 4.0–10.5)

## 2019-09-26 LAB — POCT UA - MICROSCOPIC ONLY

## 2019-09-26 LAB — COMPREHENSIVE METABOLIC PANEL
ALT: 37 U/L — ABNORMAL HIGH (ref 0–35)
AST: 22 U/L (ref 0–37)
Albumin: 4.4 g/dL (ref 3.5–5.2)
Alkaline Phosphatase: 75 U/L (ref 39–117)
BUN: 13 mg/dL (ref 6–23)
CO2: 32 mEq/L (ref 19–32)
Calcium: 9.5 mg/dL (ref 8.4–10.5)
Chloride: 103 mEq/L (ref 96–112)
Creatinine, Ser: 0.61 mg/dL (ref 0.40–1.20)
GFR: 101.03 mL/min (ref >60.00)
Glucose, Bld: 91 mg/dL (ref 70–99)
Potassium: 4 mEq/L (ref 3.5–5.1)
Sodium: 142 mEq/L (ref 135–145)
Total Bilirubin: 0.7 mg/dL (ref 0.2–1.2)
Total Protein: 6.6 g/dL (ref 6.0–8.3)

## 2019-09-26 LAB — LIPASE: Lipase: 17 U/L (ref 11.0–59.0)

## 2019-09-26 MED ORDER — LOSARTAN POTASSIUM 50 MG PO TABS: 50.0000 mg | ORAL_TABLET | Freq: Every day | ORAL | 3 refills | Status: DC

## 2019-09-26 NOTE — Assessment & Plan Note (Signed)
Contaminated UA. Send for urine culture.

## 2019-09-26 NOTE — Patient Instructions (Addendum)
Increase water.  We will call with urine culture results.  Hold ibuprofen.  Please stop at the lab to have labs drawn.  Increase Prilosec to 2 tabs daily.  Call if if not improving in 2 weeks for med change.  If improving conitnue prilosec for 4-6 weeks.  Stop lisinopril.. start losartan 50 mg daily.  Follow BP at home BP < 140/90.

## 2019-09-26 NOTE — Assessment & Plan Note (Signed)
Most likely gastritis form increase ibuprofen use during COVID infeciton.   Stop NSAIDs.  Start PPI: omeprazole 40 mg dialy.  Eval with cbc, CMET, lipase.  reviewed trigger avoidance.

## 2019-09-26 NOTE — Progress Notes (Signed)
Chief Complaint  Patient presents with  . Chest Pain    +Covid in early January  . Gastroesophageal Reflux    Gotten worse since test positive for Covid  . Urinary Frequency    History of Present Illness: HPI   59 year old female  Patient of Dr. Lorelei Pont  with HTN presents  With tightness in anterior chest x 2 weeks. Since she was positive for COVID19 in 08/2019.. she has had worsening of GERD. Pinpoint pain in central chest when reflux worse. Coughing at night when lying down.  Acid taste in throat at night.  2 days ago started with upper abdominal pain.   Trying to eat healthy.   She was taking a lot of ibuprofen when with COVID infection.   No SOB, does have some chronic wheeze. No exertional chest pain.  Taking prilosec 20 mg daily x years. Never had endoscppy. Has gallbladder, no history of ulcers.  She has also noted urinary frequency in last 2 weeks. Some dysuria,  No hematuria, no urgency.  UA today showed small LE. Micro was contaminated with epithelials,and  did have wbcs. Hx of UTI but none in last 5 years.  drinking lots of water    BP running high on lisinopril low dose.. she wishes to change given taking daily makes her foggy headed.  This visit occurred during the SARS-CoV-2 public health emergency.  Safety protocols were in place, including screening questions prior to the visit, additional usage of staff PPE, and extensive cleaning of exam room while observing appropriate contact time as indicated for disinfecting solutions.   COVID 19 screen:  No recent travel or known exposure to COVID19 The patient denies respiratory symptoms of COVID 19 at this time. The importance of social distancing was discussed today.     Review of Systems  Constitutional: Negative for chills and fever.  HENT: Negative for congestion and ear pain.   Eyes: Negative for pain and redness.  Respiratory: Negative for cough and shortness of breath.   Cardiovascular: Negative for  chest pain, palpitations and leg swelling.  Gastrointestinal: Positive for abdominal pain. Negative for blood in stool, constipation, diarrhea, nausea and vomiting.  Genitourinary: Positive for dysuria and frequency. Negative for flank pain, hematuria and urgency.  Musculoskeletal: Negative for falls and myalgias.  Skin: Negative for rash.  Neurological: Negative for dizziness.  Psychiatric/Behavioral: Negative for depression. The patient is not nervous/anxious.       Past Medical History:  Diagnosis Date  . Depression   . GERD (gastroesophageal reflux disease)   . Hypertension     reports that she has never smoked. She has never used smokeless tobacco. She reports current alcohol use of about 3.0 standard drinks of alcohol per week. She reports that she does not use drugs.   Current Outpatient Medications:  .  atorvastatin (LIPITOR) 40 MG tablet, Take 1 tablet (40 mg total) by mouth daily., Disp: 30 tablet, Rfl: 11 .  Black Cohosh 540 MG CAPS, Take 1 capsule by mouth daily., Disp: , Rfl:  .  Cholecalciferol (VITAMIN D3) 2000 units TABS, Take 1 tablet by mouth 2 (two) times daily., Disp: , Rfl:  .  folic acid (FOLVITE) 1 MG tablet, Take 1 mg by mouth daily., Disp: , Rfl:  .  lisinopril (ZESTRIL) 2.5 MG tablet, Take 1 tablet (2.5 mg total) by mouth daily., Disp: 30 tablet, Rfl: 11 .  Multiple Vitamin (MULTIVITAMIN) tablet, Take 1 tablet by mouth daily.  , Disp: , Rfl:  .  ST JOHNS WORT PO, Take by mouth.  , Disp: , Rfl:    Observations/Objective: Blood pressure (!) 178/100, pulse 80, temperature 98 F (36.7 C), temperature source Temporal, height 5' 5.5" (1.664 m), weight 183 lb 8 oz (83.2 kg), SpO2 99 %.  Physical Exam Constitutional:      General: She is not in acute distress.    Appearance: Normal appearance. She is well-developed. She is not ill-appearing or toxic-appearing.  HENT:     Head: Normocephalic.     Right Ear: Hearing, tympanic membrane, ear canal and external ear  normal. Tympanic membrane is not erythematous, retracted or bulging.     Left Ear: Hearing, tympanic membrane, ear canal and external ear normal. Tympanic membrane is not erythematous, retracted or bulging.     Nose: No mucosal edema or rhinorrhea.     Right Sinus: No maxillary sinus tenderness or frontal sinus tenderness.     Left Sinus: No maxillary sinus tenderness or frontal sinus tenderness.     Mouth/Throat:     Pharynx: Uvula midline.  Eyes:     General: Lids are normal. Lids are everted, no foreign bodies appreciated.     Conjunctiva/sclera: Conjunctivae normal.     Pupils: Pupils are equal, round, and reactive to light.  Neck:     Thyroid: No thyroid mass or thyromegaly.     Vascular: No carotid bruit.     Trachea: Trachea normal.  Cardiovascular:     Rate and Rhythm: Normal rate and regular rhythm.     Pulses: Normal pulses.     Heart sounds: Normal heart sounds, S1 normal and S2 normal. No murmur. No friction rub. No gallop.   Pulmonary:     Effort: Pulmonary effort is normal. No tachypnea or respiratory distress.     Breath sounds: Normal breath sounds. No decreased breath sounds, wheezing, rhonchi or rales.  Chest:       Comments:  ttp of anterior chest wall Abdominal:     General: Bowel sounds are normal.     Palpations: Abdomen is soft.     Tenderness: There is generalized abdominal tenderness. There is no right CVA tenderness, left CVA tenderness, guarding or rebound.     Comments:  Greatest pain in epigastrum  Musculoskeletal:     Cervical back: Normal range of motion and neck supple.  Skin:    General: Skin is warm and dry.     Findings: No rash.  Neurological:     Mental Status: She is alert.  Psychiatric:        Mood and Affect: Mood is not anxious or depressed.        Speech: Speech normal.        Behavior: Behavior normal. Behavior is cooperative.        Thought Content: Thought content normal.        Judgment: Judgment normal.      Assessment and  Plan   Atypical chest pain Not consistent with cardio or pulmonary source.  More likely GI related possibly with MSK tenderness of chest wall following COVID.  Urinary frequency Contaminated UA. Send for urine culture.  Epigastric pain Most likely gastritis form increase ibuprofen use during COVID infeciton.   Stop NSAIDs.  Start PPI: omeprazole 40 mg dialy.  Eval with cbc, CMET, lipase.  reviewed trigger avoidance. ]   Eliezer Lofts, MD

## 2019-09-26 NOTE — Assessment & Plan Note (Signed)
Not consistent with cardio or pulmonary source.  More likely GI related possibly with MSK tenderness of chest wall following COVID.

## 2019-09-27 LAB — URINE CULTURE
MICRO NUMBER:: 10146981
Result:: NO GROWTH
SPECIMEN QUALITY:: ADEQUATE

## 2019-10-01 NOTE — Telephone Encounter (Signed)
She has always been an appropriate patient.  I have never had any challenges or barriers for care with her.  Her husband has been a very nice patient for years.  Off the top of my head and with basic chart review, I do not recall. Hgb / HCT has been minimally elevated without deviation over time.

## 2019-10-02 NOTE — Telephone Encounter (Signed)
FYI: Pt wishes to transfer care... [facility] and I have accepted. She will be calling to set up a TOC appt in next 2-4 weeks with me.

## 2019-10-22 ENCOUNTER — Encounter: Payer: Self-pay | Admitting: Family Medicine

## 2019-10-30 ENCOUNTER — Inpatient Hospital Stay: Payer: BC Managed Care – PPO | Attending: Oncology | Admitting: Oncology

## 2019-10-30 ENCOUNTER — Other Ambulatory Visit: Payer: Self-pay

## 2019-10-30 ENCOUNTER — Inpatient Hospital Stay: Payer: BC Managed Care – PPO

## 2019-10-30 ENCOUNTER — Encounter: Payer: Self-pay | Admitting: Oncology

## 2019-10-30 VITALS — BP 127/93 | HR 76 | Temp 96.8°F | Wt 182.0 lb

## 2019-10-30 DIAGNOSIS — I10 Essential (primary) hypertension: Secondary | ICD-10-CM | POA: Diagnosis not present

## 2019-10-30 DIAGNOSIS — D751 Secondary polycythemia: Secondary | ICD-10-CM

## 2019-10-30 DIAGNOSIS — K219 Gastro-esophageal reflux disease without esophagitis: Secondary | ICD-10-CM | POA: Diagnosis not present

## 2019-10-30 DIAGNOSIS — Z79899 Other long term (current) drug therapy: Secondary | ICD-10-CM | POA: Diagnosis not present

## 2019-10-30 LAB — CBC
HCT: 44.7 % (ref 36.0–46.0)
Hemoglobin: 15.8 g/dL — ABNORMAL HIGH (ref 12.0–15.0)
MCH: 29.8 pg (ref 26.0–34.0)
MCHC: 35.3 g/dL (ref 30.0–36.0)
MCV: 84.2 fL (ref 80.0–100.0)
Platelets: 275 10*3/uL (ref 150–400)
RBC: 5.31 MIL/uL — ABNORMAL HIGH (ref 3.87–5.11)
RDW: 12.3 % (ref 11.5–15.5)
WBC: 6.8 10*3/uL (ref 4.0–10.5)
nRBC: 0 % (ref 0.0–0.2)

## 2019-10-30 LAB — IRON AND TIBC
Iron: 88 ug/dL (ref 28–170)
Saturation Ratios: 30 % (ref 10.4–31.8)
TIBC: 295 ug/dL (ref 250–450)
UIBC: 207 ug/dL

## 2019-10-30 LAB — FERRITIN: Ferritin: 196 ng/mL (ref 11–307)

## 2019-10-30 NOTE — Progress Notes (Signed)
New patient here for follow up on abnormal hemaglobin. Patient has concerns about aneurysm, would like to know if she could get tested. States mom had one and is concerned.

## 2019-10-30 NOTE — Progress Notes (Signed)
Joy Yates  Telephone:(336) [phone] Fax:(336) [phone]  ID: Joy Yates OB: 1960/12/05  MR#: [id number]  FUX#:[id number]  Patient Care Team: Owens Loffler, MD as PCP - General  CHIEF COMPLAINT: Polycythemia.   INTERVAL HISTORY: Patient is a 59 year old female who was noted to have an elevated hemoglobin on routine blood work.  She currently feels well and is asymptomatic.  She has no neurologic complaints.  She does not complain of any weakness or fatigue.  She has a good appetite and denies weight loss.  She has no chest pain, shortness of breath, cough, or hemoptysis.  She denies any nausea, vomiting, constipation, or diarrhea.  She has no melena or hematochezia.  She has no urinary complaints.  Patient feels at her baseline offers no specific complaints today.  REVIEW OF SYSTEMS:   Review of Systems  Constitutional: Negative.  Negative for fever, malaise/fatigue and weight loss.  Respiratory: Negative.  Negative for cough, hemoptysis and shortness of breath.   Cardiovascular: Negative.  Negative for chest pain and leg swelling.  Gastrointestinal: Positive for heartburn. Negative for abdominal pain, blood in stool, melena and nausea.  Genitourinary: Negative.  Negative for hematuria.  Musculoskeletal: Negative.  Negative for back pain.  Skin: Negative.  Negative for rash.  Neurological: Negative.  Negative for dizziness, focal weakness, weakness and headaches.  Psychiatric/Behavioral: Negative.  The patient is not nervous/anxious.     As per HPI. Otherwise, a complete review of systems is negative.  PAST MEDICAL HISTORY: Past Medical History:  Diagnosis Date  . Depression   . GERD (gastroesophageal reflux disease)   . Hypertension     PAST SURGICAL HISTORY: Past Surgical History:  Procedure Laterality Date  . APPENDECTOMY  05/2013  . COLONOSCOPY    . HEMORRHOID SURGERY     X 2  . TONSILLECTOMY    . WISDOM TOOTH EXTRACTION  2000    FAMILY  HISTORY: Family History  Problem Relation Age of Onset  . Aneurysm Mother   . Cerebral aneurysm Mother        NOVEMVER 2009  . Hypertension Mother   . Hypertension Sister   . Hypertension Brother   . Colon cancer Neg Hx   . Pancreatic cancer Neg Hx   . Stomach cancer Neg Hx   . Rectal cancer Neg Hx   . Esophageal cancer Neg Hx     ADVANCED DIRECTIVES (Y/N):  N  HEALTH MAINTENANCE: Social History   Tobacco Use  . Smoking status: Never Smoker  . Smokeless tobacco: Never Used  Substance Use Topics  . Alcohol use: Yes    Alcohol/week: 3.0 standard drinks    Types: 3 Standard drinks or equivalent per week    Comment: wine and some alcoholic drinks  . Drug use: No     Colonoscopy:  PAP:  Bone density:  Lipid panel:  No Known Allergies  Current Outpatient Medications  Medication Sig Dispense Refill  . atorvastatin (LIPITOR) 40 MG tablet Take 1 tablet (40 mg total) by mouth daily. 30 tablet 11  . Cholecalciferol (VITAMIN D3) 2000 units TABS Take 1 tablet by mouth 2 (two) times daily.    . folic acid (FOLVITE) 1 MG tablet Take 1 mg by mouth daily.    Marland Kitchen losartan (COZAAR) 50 MG tablet Take 1 tablet (50 mg total) by mouth daily. 30 tablet 3  . Black Cohosh 540 MG CAPS Take 1 capsule by mouth daily.    . Multiple Vitamin (MULTIVITAMIN) tablet Take 1 tablet by  mouth daily.      . ST JOHNS WORT PO Take by mouth.       No current facility-administered medications for this visit.    OBJECTIVE: Vitals:   10/30/19 1338  BP: (!) 127/93  Pulse: 76  Temp: (!) 96.8 F (36 C)     Body mass index is 29.83 kg/m.    ECOG FS:0 - Asymptomatic  General: Well-developed, well-nourished, no acute distress. Eyes: Pink conjunctiva, anicteric sclera. HEENT: Normocephalic, moist mucous membranes. Lungs: No audible wheezing or coughing. Heart: Regular rate and rhythm. Abdomen: Soft, nontender, no obvious distention. Musculoskeletal: No edema, cyanosis, or clubbing. Neuro: Alert,  answering all questions appropriately. Cranial nerves grossly intact. Skin: No rashes or petechiae noted. Psych: Normal affect. Lymphatics: No cervical, calvicular, axillary or inguinal LAD.   LAB RESULTS:  Lab Results  Component Value Date   NA 142 09/26/2019   K 4.0 09/26/2019   CL 103 09/26/2019   CO2 32 09/26/2019   GLUCOSE 91 09/26/2019   BUN 13 09/26/2019   CREATININE 0.61 09/26/2019   CALCIUM 9.5 09/26/2019   PROT 6.6 09/26/2019   ALBUMIN 4.4 09/26/2019   AST 22 09/26/2019   ALT 37 (H) 09/26/2019   ALKPHOS 75 09/26/2019   BILITOT 0.7 09/26/2019   GFRNONAA 96 08/11/2008   GFRAA 116 08/11/2008    Lab Results  Component Value Date   WBC 6.8 10/30/2019   NEUTROABS 4.4 09/26/2019   HGB 15.8 (H) 10/30/2019   HCT 44.7 10/30/2019   MCV 84.2 10/30/2019   PLT 275 10/30/2019     STUDIES: No results found.  ASSESSMENT: Polycythemia.  PLAN:    1.  Polycythemia: Patient's hemoglobin is mildly elevated, but relatively stable at 15.8.  The remainder of her laboratory work from today including iron stores, erythropoietin level, JAK2 mutation, carbon monoxide, and hemochromatosis mutation are all pending at time of dictation.  No intervention is needed at this time.  Patient does not require a bone marrow biopsy or phlebotomy.  She will have a video assisted telemedicine visit in 3 weeks to discuss her results.  I spent a total of 45 minutes reviewing chart data, face-to-face evaluation with the patient, counseling and coordination of care as detailed above.  Patient expressed understanding and was in agreement with this plan. She also understands that She can call clinic at any time with any questions, concerns, or complaints.     Lloyd Huger, MD   10/30/2019 3:41 PM

## 2019-10-31 LAB — ERYTHROPOIETIN: Erythropoietin: 9.2 m[IU]/mL (ref 2.6–18.5)

## 2019-10-31 LAB — CARBON MONOXIDE, BLOOD (PERFORMED AT REF LAB): Carbon Monoxide, Blood: 2.4 % (ref 0.0–3.6)

## 2019-11-04 LAB — HEMOCHROMATOSIS DNA-PCR(C282Y,H63D)

## 2019-11-07 LAB — JAK2 GENOTYPR

## 2019-11-13 NOTE — Progress Notes (Signed)
[place]  [phone] Fax:(336) [phone]  ID: LIAH MORR OB: [birth year]  MR#: [id number]  QIO#:[id number]  Patient Care Team: Owens Loffler, MD as PCP - General Grayland Ormond Kathlene November, MD as Consulting Physician (Oncology)  I connected with Maryann Alar Joy Yates on 11/20/19 at  2:45 PM EDT by video enabled telemedicine visit and verified that I am speaking with the correct person using two identifiers.   I discussed the limitations, risks, security and privacy concerns of performing an evaluation and management service by telemedicine and the availability of in-person appointments. I also discussed with the patient that there may be a patient responsible charge related to this service. The patient expressed understanding and agreed to proceed.   Other persons participating in the visit and their role in the encounter: Patient, MD.  Patient's location: Home. Provider's location: Clinic.  CHIEF COMPLAINT: Polycythemia.   INTERVAL HISTORY: Patient agreed to video assisted telemedicine visit for further evaluation and discussion of her laboratory work.  She continues to feel well and remains asymptomatic.  She has occasional dizziness, but no other neurologic complaints.  She does not complain of any weakness or fatigue.  She has a good appetite and denies weight loss.  She has no chest pain, shortness of breath, cough, or hemoptysis.  She denies any nausea, vomiting, constipation, or diarrhea.  She has no melena or hematochezia.  She has no urinary complaints.  Patient offers no further specific complaints today.  REVIEW OF SYSTEMS:   Review of Systems  Constitutional: Negative.  Negative for fever, malaise/fatigue and weight loss.  Respiratory: Negative.  Negative for cough, hemoptysis and shortness of breath.   Cardiovascular: Negative.  Negative for chest pain and leg swelling.  Gastrointestinal: Negative.  Negative for abdominal pain, blood in stool,  heartburn, melena and nausea.  Genitourinary: Negative.  Negative for hematuria.  Musculoskeletal: Negative.  Negative for back pain.  Skin: Negative.  Negative for rash.  Neurological: Positive for dizziness. Negative for focal weakness, weakness and headaches.  Psychiatric/Behavioral: Negative.  The patient is not nervous/anxious.     As per HPI. Otherwise, a complete review of systems is negative.  PAST MEDICAL HISTORY: Past Medical History:  Diagnosis Date  . Depression   . GERD (gastroesophageal reflux disease)   . Hypertension     PAST SURGICAL HISTORY: Past Surgical History:  Procedure Laterality Date  . APPENDECTOMY  05/2013  . COLONOSCOPY    . HEMORRHOID SURGERY     X 2  . TONSILLECTOMY    . WISDOM TOOTH EXTRACTION  2000    FAMILY HISTORY: Family History  Problem Relation Age of Onset  . Aneurysm Mother   . Cerebral aneurysm Mother        NOVEMVER 2009  . Hypertension Mother   . Hypertension Sister   . Hypertension Brother   . Colon cancer Neg Hx   . Pancreatic cancer Neg Hx   . Stomach cancer Neg Hx   . Rectal cancer Neg Hx   . Esophageal cancer Neg Hx     ADVANCED DIRECTIVES (Y/N):  N  HEALTH MAINTENANCE: Social History   Tobacco Use  . Smoking status: Never Smoker  . Smokeless tobacco: Never Used  Substance Use Topics  . Alcohol use: Yes    Alcohol/week: 3.0 standard drinks    Types: 3 Standard drinks or equivalent per week    Comment: wine and some alcoholic drinks  . Drug use: No     Colonoscopy:  PAP:  Bone density:  Lipid panel:  No Known Allergies  Current Outpatient Medications  Medication Sig Dispense Refill  . Black Cohosh 540 MG CAPS Take 1 capsule by mouth daily.    . Cholecalciferol (VITAMIN D3) 2000 units TABS Take 1 tablet by mouth 2 (two) times daily.    . folic acid (FOLVITE) 1 MG tablet Take 1 mg by mouth daily.    Marland Kitchen lisinopril (ZESTRIL) 2.5 MG tablet Take 1 tablet (2.5 mg total) by mouth daily. 30 tablet 5  .  losartan (COZAAR) 50 MG tablet Take 1 tablet (50 mg total) by mouth daily. 30 tablet 3  . Multiple Vitamin (MULTIVITAMIN) tablet Take 1 tablet by mouth daily.      . ST JOHNS WORT PO Take by mouth.      Marland Kitchen atorvastatin (LIPITOR) 40 MG tablet Take 1 tablet (40 mg total) by mouth daily. (Patient not taking: Reported on 11/20/2019) 30 tablet 11   No current facility-administered medications for this visit.    OBJECTIVE: There were no vitals filed for this visit.   There is no height or weight on file to calculate BMI.    ECOG FS:0 - Asymptomatic  General: Well-developed, well-nourished, no acute distress. HEENT: Normocephalic. Neuro: Alert, answering all questions appropriately. Cranial nerves grossly intact. Psych: Normal affect.   LAB RESULTS:  Lab Results  Component Value Date   NA 142 09/26/2019   K 4.0 09/26/2019   CL 103 09/26/2019   CO2 32 09/26/2019   GLUCOSE 91 09/26/2019   BUN 13 09/26/2019   CREATININE 0.61 09/26/2019   CALCIUM 9.5 09/26/2019   PROT 6.6 09/26/2019   ALBUMIN 4.4 09/26/2019   AST 22 09/26/2019   ALT 37 (H) 09/26/2019   ALKPHOS 75 09/26/2019   BILITOT 0.7 09/26/2019   GFRNONAA 96 08/11/2008   GFRAA 116 08/11/2008    Lab Results  Component Value Date   WBC 6.8 10/30/2019   NEUTROABS 4.4 09/26/2019   HGB 15.8 (H) 10/30/2019   HCT 44.7 10/30/2019   MCV 84.2 10/30/2019   PLT 275 10/30/2019     STUDIES: No results found.  ASSESSMENT: Polycythemia.  PLAN:    1.  Polycythemia: Patient's hemoglobin is mildly elevated, but relatively stable at 15.8.  The remainder of her laboratory work  including iron stores, erythropoietin level, JAK2 mutation, and carbon monoxide level are either negative or within normal limits.  No intervention is needed at this time.  Patient does not require bone marrow biopsy or phlebotomy.  After lengthy discussion with the patient, determined that no follow-up is necessary.  Please refer patient back if there are any  questions or concerns. 2.  Heterozygote hemochromatosis gene: Likely clinically insignificant. 3.  Dizziness: Patient has been instructed to follow-up with her primary care for further evaluation.   I provided 20 minutes of face-to-face video visit time during this encounter which included chart review, counseling, and coordination of care as documented above.   Patient expressed understanding and was in agreement with this plan. She also understands that She can call clinic at any time with any questions, concerns, or complaints.     Lloyd Huger, MD   11/20/2019 7:02 PM

## 2019-11-19 MED ORDER — LISINOPRIL 2.5 MG PO TABS: 2.5000 mg | ORAL_TABLET | Freq: Every day | ORAL | 5 refills | Status: DC

## 2019-11-19 NOTE — Telephone Encounter (Signed)
Okay to change her back to Lisinopril 2.5 mg daily?

## 2019-11-20 ENCOUNTER — Other Ambulatory Visit: Payer: Self-pay

## 2019-11-20 ENCOUNTER — Encounter: Payer: Self-pay | Admitting: Oncology

## 2019-11-20 ENCOUNTER — Inpatient Hospital Stay: Payer: BC Managed Care – PPO | Attending: Oncology | Admitting: Oncology

## 2019-11-20 DIAGNOSIS — D751 Secondary polycythemia: Secondary | ICD-10-CM

## 2019-11-20 NOTE — Progress Notes (Signed)
Pt reports a lot dizziness and is wanting an MRI of her brain.

## 2019-11-24 ENCOUNTER — Ambulatory Visit (INDEPENDENT_AMBULATORY_CARE_PROVIDER_SITE_OTHER): Payer: BC Managed Care – PPO | Admitting: Obstetrics and Gynecology

## 2019-11-24 ENCOUNTER — Encounter: Payer: Self-pay | Admitting: Obstetrics and Gynecology

## 2019-11-24 ENCOUNTER — Other Ambulatory Visit: Payer: Self-pay

## 2019-11-24 VITALS — BP 124/86 | HR 80 | Wt 183.0 lb

## 2019-11-24 DIAGNOSIS — Z1151 Encounter for screening for human papillomavirus (HPV): Secondary | ICD-10-CM

## 2019-11-24 DIAGNOSIS — Z01419 Encounter for gynecological examination (general) (routine) without abnormal findings: Secondary | ICD-10-CM | POA: Diagnosis not present

## 2019-11-24 DIAGNOSIS — R1032 Left lower quadrant pain: Secondary | ICD-10-CM

## 2019-11-24 NOTE — Progress Notes (Signed)
Obstetrics and Gynecology Established Patient Evaluation  Appointment Date: 11/24/2019  OB[facility]N Clinic: [facility]  Primary Care Provider: Owens Loffler  Referring Provider: Owens Loffler, MD  Chief Complaint:  Chief Complaint  Patient presents with  . Gynecologic Exam  LLQ piercing pain  History of Present Illness: Joy Yates is a 59 y.o. Caucasian G1P0010 (LMP: early 44s), seen for the above chief complaint. Her past medical history is significant for HTN, hemorrhoids, BMI 30  About a week ago she noticed LLQ piercing pain; it is there all the time, doesn't radiate, no prior s/s, no aggravating s/s and she hasn't tried anything to alleviate it.  She has been doing more yard work lately   No breast s/s, fevers, chills, chest pain, SOB, nausea, vomiting, diarrhea, constipation, dysuria, hematuria, vaginal itching, dyspareunia, blood in BMs  Review of Systems: Pertinent items are noted in HPI.   Past Medical History:  Past Medical History:  Diagnosis Date  . Depression   . GERD (gastroesophageal reflux disease)   . Hypertension     Past Surgical History:  Past Surgical History:  Procedure Laterality Date  . APPENDECTOMY  05/2013  . COLONOSCOPY    . HEMORRHOID SURGERY     X 2  . TONSILLECTOMY    . WISDOM TOOTH EXTRACTION  2000    Past Obstetrical History:  OB History  Gravida Para Term Preterm AB Living  1       1 0  SAB TAB Ectopic Multiple Live Births  1       0    # Outcome Date GA Lbr Len/2nd Weight Sex Delivery Anes PTL Lv  1 SAB            Past Gynecological History: As per HPI. History of Pap Smear(s): Yes.   Last pap 2016, which was negative History of HRT use: No.  Social History:  Social History   Socioeconomic History  . Marital status: Married    Spouse name: Not on file  . Number of children: Not on file  . Years of education: Not on file  . Highest education level: Not on file  Occupational History   . Occupation: account specialist  Tobacco Use  . Smoking status: Never Smoker  . Smokeless tobacco: Never Used  Substance and Sexual Activity  . Alcohol use: Yes    Alcohol/week: 3.0 standard drinks    Types: 3 Standard drinks or equivalent per week    Comment: wine and some alcoholic drinks  . Drug use: No  . Sexual activity: Yes  Other Topics Concern  . Not on file  Social History Narrative  . Not on file   Social Determinants of Health   Financial Resource Strain:   . Difficulty of Paying Living Expenses:   Food Insecurity:   . Worried About Charity fundraiser in the Last Year:   . Arboriculturist in the Last Year:   Transportation Needs:   . Film/video editor (Medical):   Marland Kitchen Lack of Transportation (Non-Medical):   Physical Activity:   . Days of Exercise per Week:   . Minutes of Exercise per Session:   Stress:   . Feeling of Stress :   Social Connections:   . Frequency of Communication with Friends and Family:   . Frequency of Social Gatherings with Friends and Family:   . Attends Religious Services:   . Active Member of Clubs or Organizations:   . Attends Club or  Organization Meetings:   Marland Kitchen Marital Status:   Intimate Partner Violence:   . Fear of Current or Ex-Partner:   . Emotionally Abused:   Marland Kitchen Physically Abused:   . Sexually Abused:     Family History:  Family History  Problem Relation Age of Onset  . Aneurysm Mother   . Cerebral aneurysm Mother        NOVEMVER 2009  . Hypertension Mother   . Hypertension Sister   . Hypertension Brother   . Colon cancer Neg Hx   . Pancreatic cancer Neg Hx   . Stomach cancer Neg Hx   . Rectal cancer Neg Hx   . Esophageal cancer Neg Hx    She denies any female cancers  Health Maintenance:  Mammogram(s): Yes.   Date: 07/2019, negative Colonoscopy: Yes.   Date: 02/2017. A few polyps noted-->recommend repeat in 3 years  Medications Suhayla E. Kiper "LIZ" had no medications administered during this  visit. Current Outpatient Medications  Medication Sig Dispense Refill  . atorvastatin (LIPITOR) 40 MG tablet Take 1 tablet (40 mg total) by mouth daily. 30 tablet 11  . Black Cohosh 540 MG CAPS Take 1 capsule by mouth daily.    . Cholecalciferol (VITAMIN D3) 2000 units TABS Take 1 tablet by mouth 2 (two) times daily.    . folic acid (FOLVITE) 1 MG tablet Take 1 mg by mouth daily.    Marland Kitchen lisinopril (ZESTRIL) 2.5 MG tablet Take 1 tablet (2.5 mg total) by mouth daily. 30 tablet 5  . losartan (COZAAR) 50 MG tablet Take 1 tablet (50 mg total) by mouth daily. 30 tablet 3  . Multiple Vitamin (MULTIVITAMIN) tablet Take 1 tablet by mouth daily.      . ST JOHNS WORT PO Take by mouth.       No current facility-administered medications for this visit.    Allergies Patient has no known allergies.   Physical Exam:  BP 124/86   Pulse 80   Wt 183 lb (83 kg)   BMI 29.99 kg/m  Body mass index is 29.99 kg/m. General appearance: Well nourished, well developed female in no acute distress.  Neck:  Supple, normal appearance, and no thyromegaly  Cardiovascular: normal s1 and s2.  No murmurs, rubs or gallops. Respiratory:  Clear to auscultation bilateral. Normal respiratory effort Abdomen: positive bowel sounds and no masses, hernias; diffusely non tender to palpation, non distended Breasts: breasts appear normal, no suspicious masses, no skin or nipple changes or axillary nodes, and normal palpation. Neuro/Psych:  Normal mood and affect.  Skin:  Warm and dry.  Lymphatic:  No inguinal lymphadenopathy.   Pelvic exam: is not limited by body habitus EGBUS: within normal limits, mild atrophy Vagina: within normal limits and with no blood or discharge in the vault, mild atrophy Cervix: normal appearing cervix without tenderness, discharge or lesions.  Uterus:  nonenlarged and non tender Adnexa:  normal adnexa and no mass, fullness, tenderness Rectovaginal: deferred Several external hemorrhoids  noted  Laboratory: none  Radiology: none  Assessment: pt stable  Plan:  1. LLQ abdominal pain Patient knows she needs to make repeat colonoscopy appt for sometime this year - Cytology - PAP( [place]) - Urine Culture - Urinalysis, Routine w reflex microscopic - US PELVIC COMPLETE WITH TRANSVAGINAL; Future  2. GYN Annual Routine care  RTC d/w pt re: u/s results once back  Durene Romans MD Attending Center for [facility])

## 2019-11-24 NOTE — Progress Notes (Signed)
Pt states she is having a piercing sharp pain in her lower abdomen that comes and goes  Last mamogram 07/25/2019

## 2019-11-25 LAB — MICROSCOPIC EXAMINATION
Bacteria, UA: NONE SEEN
Casts: NONE SEEN /lpf

## 2019-11-25 LAB — URINALYSIS, ROUTINE W REFLEX MICROSCOPIC
Bilirubin, UA: NEGATIVE
Glucose, UA: NEGATIVE
Leukocytes,UA: NEGATIVE
Nitrite, UA: NEGATIVE
RBC, UA: NEGATIVE
Specific Gravity, UA: >=1.03 — AB (ref 1.005–1.030)
Urobilinogen, Ur: 0.2 mg/dL (ref 0.2–1.0)
pH, UA: 5.5 (ref 5.0–7.5)

## 2019-11-25 LAB — URINE CULTURE: Organism ID, Bacteria: NO GROWTH

## 2019-11-27 LAB — CYTOLOGY - PAP
Comment: NEGATIVE
Diagnosis: NEGATIVE
High risk HPV: NEGATIVE

## 2019-12-01 ENCOUNTER — Other Ambulatory Visit: Payer: Self-pay

## 2019-12-01 ENCOUNTER — Ambulatory Visit (HOSPITAL_COMMUNITY)
Admission: RE | Admit: 2019-12-01 | Discharge: 2019-12-01 | Disposition: A | Discharge status: Home / Self Care | Payer: BC Managed Care – PPO | Source: Ambulatory Visit | Attending: Obstetrics and Gynecology | Admitting: Obstetrics and Gynecology

## 2019-12-01 DIAGNOSIS — R1032 Left lower quadrant pain: Secondary | ICD-10-CM | POA: Insufficient documentation

## 2019-12-01 DIAGNOSIS — D251 Intramural leiomyoma of uterus: Secondary | ICD-10-CM | POA: Diagnosis not present

## 2019-12-04 ENCOUNTER — Telehealth: Payer: Self-pay | Admitting: Obstetrics and Gynecology

## 2019-12-04 MED ORDER — MISOPROSTOL 200 MCG PO TABS: ORAL_TABLET | ORAL | 0 refills | Status: DC

## 2019-12-04 NOTE — Telephone Encounter (Signed)
Pt called me back and d/w her re: results. I told her degenerating fibroid could cause pain but is not an issue and should get better with time. I told her that I'm concerned about her u/s showing borderline thickening at 68mm but it also showed it to be heterogenous. On my review of the images, there is no blood flow in the lining but there is some fluid in the endometrial cavity. I told her that most common presenting symptom of uterine cancer is post menopausal bleeding but if she has cervical stenosis that that s/s could be masked. I told her I recommend in office endometrial biopsy which she is amenable to. Request sent to front desk and pt to do 265mcg of cytotec vaginally the night before.

## 2019-12-04 NOTE — Telephone Encounter (Signed)
GYN Telephone Note Pt called at [phone] but no answer and VM is not set up. In basket message sent  Durene Romans MD Attending [facility] (Faculty Practice) 12/04/2019 Time: [phone]

## 2019-12-16 ENCOUNTER — Encounter: Payer: Self-pay | Admitting: Obstetrics and Gynecology

## 2019-12-16 ENCOUNTER — Other Ambulatory Visit: Payer: Self-pay

## 2019-12-16 ENCOUNTER — Ambulatory Visit (INDEPENDENT_AMBULATORY_CARE_PROVIDER_SITE_OTHER): Payer: BC Managed Care – PPO | Admitting: Obstetrics and Gynecology

## 2019-12-16 DIAGNOSIS — N882 Stricture and stenosis of cervix uteri: Secondary | ICD-10-CM | POA: Diagnosis not present

## 2019-12-16 NOTE — Procedures (Signed)
Endometrial Biopsy Procedure Note  Pre-operative Diagnosis: Lower abdominal pain. Abnormal pelvic ultrasound  Post-operative Diagnosis: same. Cervical stenosis   Procedure Details  Patient confirmed that she did take the cytotec 244mcg last night.  Urine pregnancy test was not done.  The risks (including infection, bleeding, pain, and uterine perforation) and benefits of the procedure were explained to the patient and Written informed consent was obtained.  The patient was placed in the dorsal lithotomy position.  Bimanual exam showed the uterus to be in the neutral position.  A Graves' speculum inserted in the vagina, and the cervix was visualized and cervical stenosis identified. The cervix was then prepped with povidone iodine, and a sharp tenaculum was applied to the anterior lip of the cervix for stabilization.  Os finders were used to try and open up a small dimple but this was not successful. At this point, the procedure was aborted.   Condition: Stable  Complications: None  Plan: I told her that given the cervical stenosis and her pain, coupled with abnormal u/s (44mm endometrial stripe with heterogenous structure) that I recommend a hysteroscopy dilation and curettage to try and open that tract. Pt is amenable to this.   Durene Romans MD Attending [facility] [employer])

## 2019-12-17 ENCOUNTER — Other Ambulatory Visit: Payer: Self-pay | Admitting: Obstetrics and Gynecology

## 2019-12-17 ENCOUNTER — Other Ambulatory Visit (HOSPITAL_COMMUNITY): Payer: Self-pay | Admitting: Obstetrics and Gynecology

## 2019-12-17 DIAGNOSIS — R102 Pelvic and perineal pain: Secondary | ICD-10-CM

## 2019-12-31 ENCOUNTER — Other Ambulatory Visit: Payer: Self-pay | Admitting: Obstetrics and Gynecology

## 2020-01-01 ENCOUNTER — Encounter (HOSPITAL_BASED_OUTPATIENT_CLINIC_OR_DEPARTMENT_OTHER): Payer: Self-pay | Admitting: Obstetrics and Gynecology

## 2020-01-01 ENCOUNTER — Other Ambulatory Visit: Payer: Self-pay

## 2020-01-03 ENCOUNTER — Other Ambulatory Visit (HOSPITAL_COMMUNITY)
Admission: RE | Admit: 2020-01-03 | Discharge: 2020-01-03 | Disposition: A | Discharge status: Home / Self Care | Payer: BC Managed Care – PPO | Source: Ambulatory Visit | Attending: Obstetrics and Gynecology | Admitting: Obstetrics and Gynecology

## 2020-01-03 DIAGNOSIS — Z01812 Encounter for preprocedural laboratory examination: Secondary | ICD-10-CM | POA: Insufficient documentation

## 2020-01-03 DIAGNOSIS — Z20822 Contact with and (suspected) exposure to covid-19: Secondary | ICD-10-CM | POA: Diagnosis not present

## 2020-01-03 LAB — SARS CORONAVIRUS 2 (TAT 6-24 HRS): SARS Coronavirus 2: NEGATIVE

## 2020-01-05 MED ORDER — LISINOPRIL 2.5 MG PO TABS: 2.5000 mg | ORAL_TABLET | Freq: Every day | ORAL | 5 refills | Status: DC

## 2020-01-06 ENCOUNTER — Encounter (HOSPITAL_BASED_OUTPATIENT_CLINIC_OR_DEPARTMENT_OTHER)
Admission: RE | Admit: 2020-01-06 | Discharge: 2020-01-06 | Disposition: A | Discharge status: Home / Self Care | Payer: BC Managed Care – PPO | Source: Ambulatory Visit | Attending: Obstetrics and Gynecology | Admitting: Obstetrics and Gynecology

## 2020-01-06 ENCOUNTER — Other Ambulatory Visit: Payer: Self-pay

## 2020-01-06 DIAGNOSIS — Z01818 Encounter for other preprocedural examination: Secondary | ICD-10-CM | POA: Diagnosis not present

## 2020-01-06 LAB — BASIC METABOLIC PANEL
Anion gap: 5 (ref 5–15)
BUN: 14 mg/dL (ref 6–20)
CO2: 28 mmol/L (ref 22–32)
Calcium: 8.9 mg/dL (ref 8.9–10.3)
Chloride: 108 mmol/L (ref 98–111)
Creatinine, Ser: 0.87 mg/dL (ref 0.44–1.00)
GFR calc Af Amer: >60 mL/min (ref >60)
GFR calc non Af Amer: >60 mL/min (ref >60)
Glucose, Bld: 99 mg/dL (ref 70–99)
Potassium: 4.1 mmol/L (ref 3.5–5.1)
Sodium: 141 mmol/L (ref 135–145)

## 2020-01-06 LAB — POCT PREGNANCY, URINE: Preg Test, Ur: NEGATIVE

## 2020-01-06 NOTE — Progress Notes (Signed)

## 2020-01-06 NOTE — Progress Notes (Signed)
Attempted to call pt and remind her of pre op labs/EKG, no answer, unable to leave voicemail.

## 2020-01-07 ENCOUNTER — Ambulatory Visit (HOSPITAL_COMMUNITY)
Admission: RE | Admit: 2020-01-07 | Discharge: 2020-01-07 | Disposition: A | Discharge status: Home / Self Care | Payer: BC Managed Care – PPO | Source: Ambulatory Visit | Attending: Obstetrics and Gynecology | Admitting: Obstetrics and Gynecology

## 2020-01-07 ENCOUNTER — Other Ambulatory Visit: Payer: Self-pay

## 2020-01-07 ENCOUNTER — Ambulatory Visit (HOSPITAL_BASED_OUTPATIENT_CLINIC_OR_DEPARTMENT_OTHER): Payer: BC Managed Care – PPO | Admitting: Anesthesiology

## 2020-01-07 ENCOUNTER — Other Ambulatory Visit (HOSPITAL_COMMUNITY): Payer: Self-pay | Admitting: Obstetrics and Gynecology

## 2020-01-07 ENCOUNTER — Ambulatory Visit (HOSPITAL_BASED_OUTPATIENT_CLINIC_OR_DEPARTMENT_OTHER)
Admission: RE | Admit: 2020-01-07 | Discharge: 2020-01-07 | Disposition: A | Discharge status: Home / Self Care | Payer: BC Managed Care – PPO | Attending: Obstetrics and Gynecology | Admitting: Obstetrics and Gynecology

## 2020-01-07 ENCOUNTER — Encounter (HOSPITAL_BASED_OUTPATIENT_CLINIC_OR_DEPARTMENT_OTHER)
Admission: RE | Disposition: A | Discharge status: Home / Self Care | Payer: Self-pay | Source: Home / Self Care | Attending: Obstetrics and Gynecology

## 2020-01-07 ENCOUNTER — Encounter (HOSPITAL_BASED_OUTPATIENT_CLINIC_OR_DEPARTMENT_OTHER): Payer: Self-pay | Admitting: Obstetrics and Gynecology

## 2020-01-07 DIAGNOSIS — R9389 Abnormal findings on diagnostic imaging of other specified body structures: Secondary | ICD-10-CM | POA: Diagnosis present

## 2020-01-07 DIAGNOSIS — R102 Pelvic and perineal pain: Secondary | ICD-10-CM

## 2020-01-07 DIAGNOSIS — Z9889 Other specified postprocedural states: Secondary | ICD-10-CM

## 2020-01-07 DIAGNOSIS — K644 Residual hemorrhoidal skin tags: Secondary | ICD-10-CM | POA: Diagnosis not present

## 2020-01-07 DIAGNOSIS — K219 Gastro-esophageal reflux disease without esophagitis: Secondary | ICD-10-CM | POA: Insufficient documentation

## 2020-01-07 DIAGNOSIS — I10 Essential (primary) hypertension: Secondary | ICD-10-CM | POA: Diagnosis not present

## 2020-01-07 DIAGNOSIS — R109 Unspecified abdominal pain: Secondary | ICD-10-CM | POA: Diagnosis not present

## 2020-01-07 DIAGNOSIS — Z79899 Other long term (current) drug therapy: Secondary | ICD-10-CM | POA: Insufficient documentation

## 2020-01-07 DIAGNOSIS — N882 Stricture and stenosis of cervix uteri: Secondary | ICD-10-CM | POA: Insufficient documentation

## 2020-01-07 DIAGNOSIS — N858 Other specified noninflammatory disorders of uterus: Secondary | ICD-10-CM | POA: Diagnosis not present

## 2020-01-07 HISTORY — PX: OPERATIVE ULTRASOUND: SHX5996

## 2020-01-07 HISTORY — PX: HYSTEROSCOPY WITH D & C: SHX1775

## 2020-01-07 SURGERY — DILATATION AND CURETTAGE /HYSTEROSCOPY
Anesthesia: General | Site: Uterus

## 2020-01-07 MED ORDER — FENTANYL CITRATE (PF) 100 MCG/2ML IJ SOLN: 25.0000 ug | INTRAMUSCULAR | Status: DC | PRN

## 2020-01-07 MED ORDER — EPHEDRINE SULFATE 50 MG/ML IJ SOLN
INTRAMUSCULAR | Status: DC | PRN
Start: 2020-01-07 — End: 2020-01-07
  Administered 2020-01-07: 10 mg via INTRAVENOUS

## 2020-01-07 MED ORDER — FENTANYL CITRATE (PF) 100 MCG/2ML IJ SOLN
INTRAMUSCULAR | Status: DC | PRN
  Administered 2020-01-07: 100 ug via INTRAVENOUS

## 2020-01-07 MED ORDER — LIDOCAINE HCL (CARDIAC) PF 100 MG/5ML IV SOSY
PREFILLED_SYRINGE | INTRAVENOUS | Status: DC | PRN
  Administered 2020-01-07: 80 mg via INTRAVENOUS

## 2020-01-07 MED ORDER — PROPOFOL 500 MG/50ML IV EMUL
INTRAVENOUS | Status: AC
  Filled 2020-01-07: qty 50

## 2020-01-07 MED ORDER — METHYLERGONOVINE MALEATE 0.2 MG/ML IJ SOLN
INTRAMUSCULAR | Status: AC
  Filled 2020-01-07: qty 1

## 2020-01-07 MED ORDER — SUCCINYLCHOLINE CHLORIDE 200 MG/10ML IV SOSY
PREFILLED_SYRINGE | INTRAVENOUS | Status: AC
  Filled 2020-01-07: qty 10

## 2020-01-07 MED ORDER — DEXAMETHASONE SODIUM PHOSPHATE 4 MG/ML IJ SOLN
INTRAMUSCULAR | Status: DC | PRN
  Administered 2020-01-07: 10 mg via INTRAVENOUS

## 2020-01-07 MED ORDER — ONDANSETRON HCL 4 MG/2ML IJ SOLN
INTRAMUSCULAR | Status: DC | PRN
  Administered 2020-01-07: 4 mg via INTRAVENOUS

## 2020-01-07 MED ORDER — OXYCODONE HCL 5 MG PO TABS
5.0000 mg | ORAL_TABLET | Freq: Once | ORAL | Status: AC | PRN
  Administered 2020-01-07: 5 mg via ORAL

## 2020-01-07 MED ORDER — DIPHENHYDRAMINE HCL 50 MG/ML IJ SOLN
INTRAMUSCULAR | Status: DC | PRN
  Administered 2020-01-07: 6.25 mg via INTRAVENOUS

## 2020-01-07 MED ORDER — EPHEDRINE 5 MG/ML INJ
INTRAVENOUS | Status: AC
  Filled 2020-01-07: qty 10

## 2020-01-07 MED ORDER — CARBOPROST TROMETHAMINE 250 MCG/ML IM SOLN
INTRAMUSCULAR | Status: AC
  Filled 2020-01-07: qty 1

## 2020-01-07 MED ORDER — ONDANSETRON HCL 4 MG/2ML IJ SOLN
INTRAMUSCULAR | Status: AC
  Filled 2020-01-07: qty 2

## 2020-01-07 MED ORDER — DIPHENHYDRAMINE HCL 50 MG/ML IJ SOLN
INTRAMUSCULAR | Status: AC
  Filled 2020-01-07: qty 1

## 2020-01-07 MED ORDER — OXYCODONE HCL 5 MG PO TABS
ORAL_TABLET | ORAL | Status: AC
  Filled 2020-01-07: qty 1

## 2020-01-07 MED ORDER — SILVER NITRATE-POT NITRATE 75-25 % EX MISC
CUTANEOUS | Status: DC | PRN
  Administered 2020-01-07: 2

## 2020-01-07 MED ORDER — PHENYLEPHRINE 40 MCG/ML (10ML) SYRINGE FOR IV PUSH (FOR BLOOD PRESSURE SUPPORT)
PREFILLED_SYRINGE | INTRAVENOUS | Status: AC
  Filled 2020-01-07: qty 10

## 2020-01-07 MED ORDER — DEXAMETHASONE SODIUM PHOSPHATE 10 MG/ML IJ SOLN
INTRAMUSCULAR | Status: AC
  Filled 2020-01-07: qty 1

## 2020-01-07 MED ORDER — MISOPROSTOL 200 MCG PO TABS
ORAL_TABLET | ORAL | Status: AC
  Filled 2020-01-07: qty 5

## 2020-01-07 MED ORDER — SODIUM CHLORIDE 0.9 % IR SOLN
Status: DC | PRN
  Administered 2020-01-07: 3000 mL

## 2020-01-07 MED ORDER — LIDOCAINE HCL 1 % IJ SOLN
INTRAMUSCULAR | Status: DC | PRN
  Administered 2020-01-07: 20 mL

## 2020-01-07 MED ORDER — ONDANSETRON HCL 4 MG/2ML IJ SOLN: 4.0000 mg | Freq: Once | INTRAMUSCULAR | Status: DC | PRN

## 2020-01-07 MED ORDER — SILVER NITRATE-POT NITRATE 75-25 % EX MISC
CUTANEOUS | Status: AC
  Filled 2020-01-07: qty 10

## 2020-01-07 MED ORDER — OXYCODONE HCL 5 MG/5ML PO SOLN: 5.0000 mg | Freq: Once | ORAL | Status: AC | PRN

## 2020-01-07 MED ORDER — PROPOFOL 10 MG/ML IV BOLUS
INTRAVENOUS | Status: DC | PRN
  Administered 2020-01-07: 200 mg via INTRAVENOUS

## 2020-01-07 MED ORDER — OXYTOCIN 10 UNIT/ML IJ SOLN
INTRAMUSCULAR | Status: AC
  Filled 2020-01-07: qty 1

## 2020-01-07 MED ORDER — LACTATED RINGERS IV SOLN
INTRAVENOUS | Status: DC | PRN
Start: 2020-01-07 — End: 2020-01-07

## 2020-01-07 MED ORDER — OXYCODONE-ACETAMINOPHEN 5-325 MG PO TABS: 1.0000 | ORAL_TABLET | Freq: Four times a day (QID) | ORAL | 0 refills | Status: DC | PRN

## 2020-01-07 MED ORDER — FENTANYL CITRATE (PF) 100 MCG/2ML IJ SOLN
INTRAMUSCULAR | Status: AC
  Filled 2020-01-07: qty 2

## 2020-01-07 MED ORDER — MIDAZOLAM HCL 5 MG/5ML IJ SOLN
INTRAMUSCULAR | Status: DC | PRN
  Administered 2020-01-07: 2 mg via INTRAVENOUS

## 2020-01-07 MED ORDER — DOCUSATE SODIUM 100 MG PO CAPS
100.0000 mg | ORAL_CAPSULE | Freq: Two times a day (BID) | ORAL | 0 refills | Status: AC
Start: 2020-01-07 — End: 2020-01-14

## 2020-01-07 MED ORDER — BUPIVACAINE HCL (PF) 0.5 % IJ SOLN
INTRAMUSCULAR | Status: AC
  Filled 2020-01-07: qty 30

## 2020-01-07 MED ORDER — MIDAZOLAM HCL 2 MG/2ML IJ SOLN
INTRAMUSCULAR | Status: AC
  Filled 2020-01-07: qty 2

## 2020-01-07 MED ORDER — LIDOCAINE 2% (20 MG/ML) 5 ML SYRINGE
INTRAMUSCULAR | Status: AC
  Filled 2020-01-07: qty 5

## 2020-01-07 MED ORDER — LIDOCAINE HCL (PF) 1 % IJ SOLN
INTRAMUSCULAR | Status: AC
  Filled 2020-01-07: qty 30

## 2020-01-07 SURGICAL SUPPLY — 16 items
CATH ROBINSON RED A/P 16FR (CATHETERS) ×2 IMPLANT
DILATOR CANAL MILEX (MISCELLANEOUS) ×1 IMPLANT
GAUZE 4X4 16PLY RFD (DISPOSABLE) ×3 IMPLANT
GLOVE BIOGEL PI IND STRL 7.0 (GLOVE) ×2 IMPLANT
GLOVE BIOGEL PI IND STRL 7.5 (GLOVE) ×2 IMPLANT
GLOVE BIOGEL PI INDICATOR 7.0 (GLOVE) ×1
GLOVE BIOGEL PI INDICATOR 7.5 (GLOVE) ×1
GLOVE SURG SS PI 7.0 STRL IVOR (GLOVE) ×3 IMPLANT
GOWN STRL REUS W/TWL LRG LVL3 (GOWN DISPOSABLE) ×3 IMPLANT
GOWN STRL REUS W/TWL XL LVL3 (GOWN DISPOSABLE) ×3 IMPLANT
KIT PROCEDURE FLUENT (KITS) ×3 IMPLANT
PACK VAGINAL MINOR WOMEN LF (CUSTOM PROCEDURE TRAY) ×3 IMPLANT
PAD OB MATERNITY 4.3X12.25 (PERSONAL CARE ITEMS) ×3 IMPLANT
PAD PREP 24X48 CUFFED NSTRL (MISCELLANEOUS) ×3 IMPLANT
SLEEVE SCD COMPRESS KNEE MED (MISCELLANEOUS) ×3 IMPLANT
TOWEL GREEN STERILE FF (TOWEL DISPOSABLE) ×6 IMPLANT

## 2020-01-07 NOTE — Anesthesia Postprocedure Evaluation (Signed)
Anesthesia Post Note  Patient: Joy Yates  Procedure(s) Performed: DILATATION AND CURETTAGE /HYSTEROSCOPY (N/A Uterus) OPERATIVE ULTRASOUND (N/A Abdomen)     Patient location during evaluation: PACU Anesthesia Type: General Level of consciousness: awake and alert Pain management: pain level controlled Vital Signs Assessment: post-procedure vital signs reviewed and stable Respiratory status: spontaneous breathing, nonlabored ventilation and respiratory function stable Cardiovascular status: blood pressure returned to baseline and stable Postop Assessment: no apparent nausea or vomiting Anesthetic complications: no    Last Vitals:  Vitals:   01/07/20 0845 01/07/20 0848  BP: (!) 179/119 (!) 168/104  Pulse: 84 88  Resp: 14 13  Temp:    SpO2: 100% 100%    Last Pain:  Vitals:   01/07/20 0848  TempSrc:   PainSc: 2                  Lidia Collum

## 2020-01-07 NOTE — Addendum Note (Signed)
Addendum  created 01/07/20 1322 by Willa Frater, CRNA   Charge Capture section accepted

## 2020-01-07 NOTE — Transfer of Care (Signed)
Immediate Anesthesia Transfer of Care Note  Patient: Joy Yates  Procedure(s) Performed: DILATATION AND CURETTAGE /HYSTEROSCOPY (N/A Uterus) OPERATIVE ULTRASOUND (N/A Abdomen)  Patient Location: PACU  Anesthesia Type:General  Level of Consciousness: sedated  Airway & Oxygen Therapy: Patient Spontanous Breathing and Patient connected to face mask oxygen  Post-op Assessment: Report given to RN and Post -op Vital signs reviewed and stable  Post vital signs: Reviewed and stable  Last Vitals:  Vitals Value Taken Time  BP    Temp    Pulse    Resp 14 01/07/20 0825  SpO2    Vitals shown include unvalidated device data.  Last Pain:  Vitals:   01/07/20 0637  TempSrc: Oral  PainSc: 0-No pain         Complications: No apparent anesthesia complications

## 2020-01-07 NOTE — Anesthesia Procedure Notes (Signed)
Procedure Name: LMA Insertion Date/Time: 01/07/2020 7:37 AM Performed by: Willa Frater, CRNA Pre-anesthesia Checklist: Patient identified, Emergency Drugs available, Suction available and Patient being monitored Patient Re-evaluated:Patient Re-evaluated prior to induction Oxygen Delivery Method: Circle system utilized Preoxygenation: Pre-oxygenation with 100% oxygen Induction Type: IV induction Ventilation: Mask ventilation without difficulty LMA: LMA inserted LMA Size: 4.0 Number of attempts: 1 Airway Equipment and Method: Bite block Placement Confirmation: positive ETCO2 Tube secured with: Tape Dental Injury: Teeth and Oropharynx as per pre-operative assessment

## 2020-01-07 NOTE — Op Note (Signed)
Operative Note   01/07/2020  PRE-OP DIAGNOSIS: Cervical Stenosis. Abdominal pain. Abnormal endometrium on ultrasound   POST-OP DIAGNOSIS: Same   SURGEON: Surgeon(s) and Role:    * Aletha Halim, MD - Primary  ASSISTANT: None  PROCEDURE: Ultrasound guided cervical dilation, endocervical curettage, hysteroscopy, uterine curettage  ANESTHESIA: General and paracervical block  ESTIMATED BLOOD LOSS: 65mL  DRAINS: I/O cath done pre op   TOTAL IV FLUIDS: see anesthesia note  SPECIMENS: endocervical curettage, endometrial curettings  VTE PROPHYLAXIS: SCDs to the bilateral lower extremities  ANTIBIOTICS: Not indicated  FLUID DEFICIT: [id number]  COMPLICATIONS: None  DISPOSITION: PACU - hemodynamically stable.  CONDITION: stable  FINDINGS: Exam under anesthesia revealed small, mobile mid plane uterus with no masses and bilateral adnexa without masses or fullness.  4-5 external hemorrhoids 1.5-2cm in size noted. EGBUS and vagina normal. Cervix grossly normal and cervical stenosis noted.   Hysteroscopy revealed a grossly normal, atrophic appearing uterine cavity with bilateral tubal ostia and normal appearing endocervical canal. Small indentation at the uterine fundus noted. End of case hysteroscopy negative. With good curettage coverage noted in the uterine cavity  PROCEDURE IN DETAIL:  After informed consent was obtained, the patient was taken to the operating room where anesthesia was obtained without difficulty. The patient was positioned in the dorsal lithotomy position in [place].  The patient's bladder was catheterized with an in and out foley catheter.  The patient was examined under anesthesia, with the above noted findings.  The bi-valved speculum was placed inside the patient's vagina, and the the anterior lip of the cervix was seen and grasped with the tenaculum.  A paracervical block was achieved with 66mL of 1% lidocaine.  Lacrimal duct probes used to open the os. Using  ultrasound guidance, os finder and uterine sound used to enter the uterine cavity; pt sounded to 7.5cm.  ECC done. The cervix was progressively dilated to a 17 French-Pratt dilator.  The hysteroscope was introduced, with the above noted findings. The hystersocope was removed and the uterine cavity was curetted until a gritty texture was noted, yielding scant endometrial curettings.  Repeat hysteroscopy done and it was negative. Excellent hemostasis was noted, and all instruments were removed, with excellent hemostasis noted throughout.  She was then taken out of dorsal lithotomy. The patient tolerated the procedure well.  Sponge, lap and instrument counts were correct x2.  The patient was taken to recovery room in excellent condition.  Durene Romans MD Attending Center for [employer] farm manager)

## 2020-01-07 NOTE — H&P (Signed)
Obstetrics & Gynecology Surgical H&P   Date of Surgery: 01/07/2020    Primary OBGYN: [facility]  Reason for Admission: Scheduled surgery  History of Present Illness: Joy Yates is a 59 y.o. G1P0010 (No LMP recorded. Patient is postmenopausal.), with the above CC. PMHx is significant for HTN, GERD.  Patient seen by me and LLQ noted. Pap smear negative but unable to perform embx due to cervical stenosis. U/s showed normal sized uterus with borderline thickness of endomtrial lining at 8mm and it was noted to be complex and mildly heterogenous.   ROS: A 12-point review of systems was performed and negative, except as stated in the above HPI.  OBGYN History: As per HPI. OB History  Gravida Para Term Preterm AB Living  1       1 0  SAB TAB Ectopic Multiple Live Births  1       0    # Outcome Date GA Lbr Len/2nd Weight Sex Delivery Anes PTL Lv  1 SAB             Past Medical History: Past Medical History:  Diagnosis Date  . Depression   . GERD (gastroesophageal reflux disease)   . Hypertension     Past Surgical History: Past Surgical History:  Procedure Laterality Date  . APPENDECTOMY  05/2013  . COLONOSCOPY    . HEMORRHOID SURGERY     X 2  . TONSILLECTOMY    . WISDOM TOOTH EXTRACTION  2000    Family History:  Family History  Problem Relation Age of Onset  . Aneurysm Mother   . Cerebral aneurysm Mother        NOVEMVER 2009  . Hypertension Mother   . Hypertension Sister   . Hypertension Brother   . Colon cancer Neg Hx   . Pancreatic cancer Neg Hx   . Stomach cancer Neg Hx   . Rectal cancer Neg Hx   . Esophageal cancer Neg Hx      Social History:  Social History   Socioeconomic History  . Marital status: Married    Spouse name: Not on file  . Number of children: Not on file  . Years of education: Not on file  . Highest education level: Not on file  Occupational History  . Occupation: account specialist  Tobacco Use  .  Smoking status: Never Smoker  . Smokeless tobacco: Never Used  Substance and Sexual Activity  . Alcohol use: Yes    Alcohol/week: 3.0 standard drinks    Types: 3 Standard drinks or equivalent per week    Comment: wine and some alcoholic drinks  . Drug use: No  . Sexual activity: Yes  Other Topics Concern  . Not on file  Social History Narrative  . Not on file   Social Determinants of Health   Financial Resource Strain:   . Difficulty of Paying Living Expenses:   Food Insecurity:   . Worried About Charity fundraiser in the Last Year:   . Arboriculturist in the Last Year:   Transportation Needs:   . Film/video editor (Medical):   Marland Kitchen Lack of Transportation (Non-Medical):   Physical Activity:   . Days of Exercise per Week:   . Minutes of Exercise per Session:   Stress:   . Feeling of Stress :   Social Connections:   . Frequency of Communication with Friends and Family:   . Frequency of Social Gatherings with Friends and Family:   .  Attends Religious Services:   . Active Member of Clubs or Organizations:   . Attends Archivist Meetings:   Marland Kitchen Marital Status:   Intimate Partner Violence:   . Fear of Current or Ex-Partner:   . Emotionally Abused:   Marland Kitchen Physically Abused:   . Sexually Abused:      Allergy: No Known Allergies  Current Outpatient Medications: Medications Prior to Admission  Medication Sig Dispense Refill Last Dose  . atorvastatin (LIPITOR) 40 MG tablet Take 1 tablet (40 mg total) by mouth daily. 30 tablet 11 01/06/2020 at Unknown time  . Cholecalciferol (VITAMIN D3) 2000 units TABS Take 1 tablet by mouth 2 (two) times daily.   Past Week at Unknown time  . folic acid (FOLVITE) 1 MG tablet Take 1 mg by mouth daily.   Past Week at Unknown time  . ST JOHNS WORT PO Take by mouth.     Past Month at Unknown time  . UNABLE TO FIND Pt doesn't know name of BP med she takes... taken last night   01/06/2020 at Unknown time     Hospital Medications: No  current facility-administered medications for this encounter.     Physical Exam:  Current Vital Signs 24h Vital Sign Ranges  T 97.7 F (36.5 C) Temp  Avg: 97.7 F (36.5 C)  Min: 97.7 F (36.5 C)  Max: 97.7 F (36.5 C)  BP (!) 150/91 BP  Min: 150/91  Max: 150/91  HR 71 Pulse  Avg: 71  Min: 71  Max: 71  RR 20 Resp  Avg: 20  Min: 20  Max: 20  SaO2 100 % Room Air SpO2  Avg: 100 %  Min: 100 %  Max: 100 %       24 Hour I/O Current Shift I/O  Time Ins Outs No intake/output data recorded. No intake/output data recorded.    Body mass index is 29.53 kg/m. General appearance: Well nourished, well developed female in no acute distress.  Cardiovascular: S1, S2 normal, no murmur, rub or gallop, regular rate and rhythm Respiratory:  Clear to auscultation bilateral. Normal respiratory effort Abdomen: positive bowel sounds and no masses, hernias; diffusely non tender to palpation, non distended Neuro/Psych:  Normal mood and affect.  Skin:  Warm and dry.  Extremities: no clubbing, cyanosis, or edema.    Laboratory: UPT: negative COVID: negative No results for input(s): WBC, HGB, HCT, PLT in the last 168 hours. Recent Labs  Lab 01/06/20 1016  NA 141  K 4.1  CL 108  CO2 28  BUN 14  CREATININE 0.87  CALCIUM 8.9  GLUCOSE 99   Imaging:  CLINICAL DATA:  Initial evaluation for left lower quadrant discomfort.  EXAM: TRANSABDOMINAL AND TRANSVAGINAL ULTRASOUND OF PELVIS  TECHNIQUE: Both transabdominal and transvaginal ultrasound examinations of the pelvis were performed. Transabdominal technique was performed for global imaging of the pelvis including uterus, ovaries, adnexal regions, and pelvic cul-de-sac. It was necessary to proceed with endovaginal exam following the transabdominal exam to visualize the uterus, endometrium, and ovaries.  COMPARISON:  None available.  FINDINGS: Uterus  Measurements: 7.7 x 3.4 x 4.3 cm = volume: 58.6 mL. Heterogeneous echotexture seen  throughout the uterine myometrium. 2.8 x 2.4 x 3.3 cm exophytic fibroid seen extending from the anterior margin of the uterine fundus. Additional 1.3 x 0.8 x 0.9 cm intramural fibroid present at the right anterior mid uterine body.  Endometrium  Thickness: 5 mm. Endometrial complex mildly heterogeneous without discrete lesion or mass. Small amount of  simple anechoic fluid present within the endometrial cavity.  Right ovary  Measurements: 2.1 x 1.5 x 1.5 cm = volume: 2.5 mL. Normal appearance/no adnexal mass.  Left ovary  Measurements: 2.2 x 1.5 x 1.4 cm = volume: 2.4 mL. Normal appearance/no adnexal mass.  Other findings  No abnormal free fluid.  IMPRESSION: 1. Fibroid uterus as detailed above. 2. Otherwise unremarkable pelvic ultrasound, with normal sonographic appearance of the ovaries. No adnexal mass or other abnormality to explain patient's symptoms identified.   Electronically Signed   By: Jeannine Boga M.D.   On: 12/01/2019 21:38  Assessment: Joy Yates is a 59 y.o. G1P0010 here for scheduled surgery. Pt doing well  Plan: Patient consented for hysteroscopy, d&c with PRN u/s guidance. Can proceed when OR is ready   Durene Romans MD Attending Center for [place] (Faculty Practice) [phone]

## 2020-01-07 NOTE — Anesthesia Preprocedure Evaluation (Signed)
Anesthesia Evaluation  Patient identified by MRN, date of birth, ID band Patient awake    Reviewed: Allergy & Precautions, NPO status , Patient's Chart, lab work & pertinent test results  History of Anesthesia Complications Negative for: history of anesthetic complications  Airway Mallampati: I  TM Distance: >3 FB Neck ROM: Full    Dental  (+) Teeth Intact   Pulmonary neg pulmonary ROS,    Pulmonary exam normal        Cardiovascular hypertension, Normal cardiovascular exam     Neuro/Psych negative neurological ROS  negative psych ROS   GI/Hepatic Neg liver ROS, GERD  ,  Endo/Other  negative endocrine ROS  Renal/GU negative Renal ROS  negative genitourinary   Musculoskeletal negative musculoskeletal ROS (+)   Abdominal   Peds  Hematology negative hematology ROS (+)   Anesthesia Other Findings   Reproductive/Obstetrics                            Anesthesia Physical Anesthesia Plan  ASA: II  Anesthesia Plan: General   Post-op Pain Management:    Induction: Intravenous  PONV Risk Score and Plan: 3 and Ondansetron, Dexamethasone, Midazolam and Treatment may vary due to age or medical condition  Airway Management Planned: LMA  Additional Equipment: None  Intra-op Plan:   Post-operative Plan: Extubation in OR  Informed Consent: I have reviewed the patients History and Physical, chart, labs and discussed the procedure including the risks, benefits and alternatives for the proposed anesthesia with the patient or authorized representative who has indicated his/her understanding and acceptance.     Dental advisory given  Plan Discussed with:   Anesthesia Plan Comments:         Anesthesia Quick Evaluation

## 2020-01-07 NOTE — Discharge Instructions (Addendum)
   We will discuss your surgery once again in detail at your post-op visit in two to four weeks. If you haven’t already done so, please call to make your appointment as soon as possible. ° °Dilation and Curettage or Vacuum Curettage, Care After °These instructions give you information on caring for yourself after your procedure. Your doctor may also give you more specific instructions. Call your doctor if you have any problems or questions after your procedure. °HOME CARE °· Do not drive for 24 hours. °· Wait 1 week before doing any activities that wear you out. °· Do not stand for a long time. °· Limit stair climbing to once or twice a day. °· Rest often. °· Continue with your usual diet. °· Drink enough fluids to keep your pee (urine) clear or pale yellow. °· If you have a hard time pooping (constipation), you may: °¨ Take a medicine to help you go poop (laxative) as told by your doctor. °¨ Eat more fruit and bran. °¨ Drink more fluids. °· Take showers, not baths, for as long as told by your doctor. °· Do not swim or use a hot tub until your doctor says it is okay. °· Have someone with you for 1day after the procedure. °· Do not douche, use tampons, or have sex (intercourse) until seen by your doctor °· Only take medicines as told by your doctor. Do not take aspirin. It can cause bleeding. °· Keep all doctor visits. °GET HELP IF: °· You have cramps or pain not helped by medicine. °· You have new pain in the belly (abdomen). °· You have a bad smelling fluid coming from your vagina. °· You have a rash. °· You have problems with any medicine. °GET HELP RIGHT AWAY IF:  °· You start to bleed more than a regular period. °· You have a fever. °· You have chest pain. °· You have trouble breathing. °· You feel dizzy or feel like passing out (fainting). °· You pass out. °· You have pain in the tops of your shoulders. °· You have vaginal bleeding with or without clumps of blood (blood clots). °MAKE SURE YOU: °· Understand  these instructions. °· Will watch your condition. °· Will get help right away if you are not doing well or get worse. °Document Released: 05/09/2008 Document Revised: 08/05/2013 Document Reviewed: 02/27/2013 °ExitCare® Patient Information ©2015 [employer]. This information is not intended to replace advice given to you by your health care provider. Make sure you discuss any questions you have with your health care provider. ° ° ° ° °Post Anesthesia Home Care Instructions ° °Activity: °Get plenty of rest for the remainder of the day. A responsible individual must stay with you for 24 hours following the procedure.  °For the next 24 hours, DO NOT: °-Drive a car °-Operate machinery °-Drink alcoholic beverages °-Take any medication unless instructed by your physician °-Make any legal decisions or sign important papers. ° °Meals: °Start with liquid foods such as gelatin or soup. Progress to regular foods as tolerated. Avoid greasy, spicy, heavy foods. If nausea and/or vomiting occur, drink only clear liquids until the nausea and/or vomiting subsides. Call your physician if vomiting continues. ° °Special Instructions/Symptoms: °Your throat may feel dry or sore from the anesthesia or the breathing tube placed in your throat during surgery. If this causes discomfort, gargle with warm salt water. The discomfort should disappear within 24 hours. ° ° ° °

## 2020-01-08 ENCOUNTER — Encounter: Payer: Self-pay | Admitting: *Deleted

## 2020-01-08 LAB — SURGICAL PATHOLOGY

## 2020-01-23 MED ORDER — LISINOPRIL 2.5 MG PO TABS: 2.5000 mg | ORAL_TABLET | Freq: Every day | ORAL | Status: DC

## 2020-02-05 ENCOUNTER — Encounter: Payer: Self-pay | Admitting: Obstetrics and Gynecology

## 2020-02-05 ENCOUNTER — Ambulatory Visit (INDEPENDENT_AMBULATORY_CARE_PROVIDER_SITE_OTHER): Payer: BC Managed Care – PPO | Admitting: Obstetrics and Gynecology

## 2020-02-05 ENCOUNTER — Other Ambulatory Visit: Payer: Self-pay

## 2020-02-05 VITALS — BP 158/98 | HR 74 | Wt 180.0 lb

## 2020-02-05 DIAGNOSIS — Z09 Encounter for follow-up examination after completed treatment for conditions other than malignant neoplasm: Secondary | ICD-10-CM

## 2020-02-05 NOTE — Progress Notes (Signed)
[facility] for [place] 02/05/2020  CC: regular post op visit  Subjective:     Joy Yates is a 59 y.o. postmenopausal patient s/p 5/26 cervical dilation, hysteroscopy, d&c, ecc for h/o abdominal pain and abnormal endometrium on u/s   Normal, atrophic appearing anatomy on hysteroscopy and negative endometrial and endocervical curettings  Review of Systems Pertinent items noted in HPI and remainder of comprehensive ROS otherwise negative.    Objective:    BP (!) 158/98   Pulse 74   Wt 180 lb (81.6 kg)   BMI 29.05 kg/m  NAD  Assessment:    Doing well postoperatively. Operative findings again reviewed. Pathology report discussed.    Plan:   Routine care.   Durene Romans MD Attending Center for [facility] (Faculty Practice) 02/05/2020

## 2020-03-01 MED ORDER — LISINOPRIL 2.5 MG PO TABS: 2.5000 mg | ORAL_TABLET | Freq: Every day | ORAL | 2 refills | Status: DC

## 2020-06-11 ENCOUNTER — Ambulatory Visit (INDEPENDENT_AMBULATORY_CARE_PROVIDER_SITE_OTHER): Payer: BC Managed Care – PPO | Admitting: Family Medicine

## 2020-06-11 ENCOUNTER — Encounter: Payer: Self-pay | Admitting: Family Medicine

## 2020-06-11 ENCOUNTER — Other Ambulatory Visit: Payer: Self-pay

## 2020-06-11 VITALS — BP 144/90 | HR 79 | Temp 98.4°F | Ht 65.0 in | Wt 182.0 lb

## 2020-06-11 DIAGNOSIS — Z0000 Encounter for general adult medical examination without abnormal findings: Secondary | ICD-10-CM | POA: Diagnosis not present

## 2020-06-11 DIAGNOSIS — Z13220 Encounter for screening for lipoid disorders: Secondary | ICD-10-CM | POA: Diagnosis not present

## 2020-06-11 DIAGNOSIS — R5383 Other fatigue: Secondary | ICD-10-CM | POA: Diagnosis not present

## 2020-06-11 DIAGNOSIS — Z23 Encounter for immunization: Secondary | ICD-10-CM

## 2020-06-11 DIAGNOSIS — I10 Essential (primary) hypertension: Secondary | ICD-10-CM | POA: Diagnosis not present

## 2020-06-11 NOTE — Progress Notes (Signed)
Chief Complaint  Patient presents with  . Annual Exam    History of Present Illness: HPI  The patient is here for annual wellness exam and preventative care.    Hypertension:  Not at goal in office BP Readings from Last 3 Encounters:  06/11/20 (!) 144/90  02/05/20 (!) 158/98  01/07/20 (!) 148/89  Using medication without problems or lightheadedness: none Chest pain with exertion:none Edema:none Short of breath:none Average home BPs: Other issues:  Polycythemia followed by Dr. [facility]  Recent surgery for cervical dilation  This visit occurred during the SARS-CoV-2 public health emergency.  Safety protocols were in place, including screening questions prior to the visit, additional usage of staff PPE, and extensive cleaning of exam room while observing appropriate contact time as indicated for disinfecting solutions.   COVID 19 screen:  No recent travel or known exposure to COVID19 The patient denies respiratory symptoms of COVID 19 at this time. The importance of social distancing was discussed today.     Review of Systems  Constitutional: Negative for chills and fever.  HENT: Negative for congestion and ear pain.   Eyes: Negative for pain and redness.  Respiratory: Negative for cough and shortness of breath.   Cardiovascular: Negative for chest pain, palpitations and leg swelling.  Gastrointestinal: Negative for abdominal pain, blood in stool, constipation, diarrhea, nausea and vomiting.  Genitourinary: Negative for dysuria.  Musculoskeletal: Negative for falls and myalgias.  Skin: Negative for rash.  Neurological: Negative for dizziness.  Psychiatric/Behavioral: Negative for depression. The patient is not nervous/anxious.       Past Medical History:  Diagnosis Date  . Depression   . GERD (gastroesophageal reflux disease)   . Hypertension     reports that she has never smoked. She has never used smokeless tobacco. She reports current alcohol use of  about 3.0 standard drinks of alcohol per week. She reports that she does not use drugs.   Current Outpatient Medications:  .  atorvastatin (LIPITOR) 40 MG tablet, Take 1 tablet (40 mg total) by mouth daily., Disp: 30 tablet, Rfl: 11 .  Cholecalciferol (VITAMIN D3) 2000 units TABS, Take 1 tablet by mouth 2 (two) times daily., Disp: , Rfl:  .  folic acid (FOLVITE) 1 MG tablet, Take 1 mg by mouth daily., Disp: , Rfl:  .  lisinopril (ZESTRIL) 2.5 MG tablet, Take 1 tablet (2.5 mg total) by mouth daily., Disp: 30 tablet, Rfl: 2 .  ST JOHNS WORT PO, Take by mouth.  , Disp: , Rfl:    Observations/Objective: Blood pressure (!) 144/90, pulse 79, temperature 98.4 F (36.9 C), temperature source Temporal, height 5\' 5"  (1.651 m), weight 182 lb (82.6 kg), SpO2 97 %.  Physical Exam Constitutional:      General: She is not in acute distress.Vital signs are normal.     Appearance: Normal appearance. She is well-developed and well-nourished. She is not ill-appearing or toxic-appearing.  HENT:     Head: Normocephalic.     Right Ear: Hearing, tympanic membrane, ear canal and external ear normal.     Left Ear: Hearing, tympanic membrane, ear canal and external ear normal.     Nose: Nose normal.  Eyes:     General: Lids are normal. Lids are everted, no foreign bodies appreciated.     Extraocular Movements: EOM normal.     Conjunctiva/sclera: Conjunctivae normal.     Pupils: Pupils are equal, round, and reactive to light.  Neck:     Thyroid: No thyroid mass or  thyromegaly.     Vascular: No carotid bruit.     Trachea: Trachea normal.  Cardiovascular:     Rate and Rhythm: Normal rate and regular rhythm.     Pulses: Intact distal pulses.     Heart sounds: Normal heart sounds, S1 normal and S2 normal. No murmur heard. No gallop.   Pulmonary:     Effort: Pulmonary effort is normal. No respiratory distress.     Breath sounds: Normal breath sounds. No wheezing, rhonchi or rales.  Abdominal:     General:  Bowel sounds are normal. There is no distension or abdominal bruit.     Palpations: Abdomen is soft. There is no fluid wave, hepatosplenomegaly or mass.     Tenderness: There is no abdominal tenderness. There is no CVA tenderness, guarding or rebound.     Hernia: No hernia is present.  Musculoskeletal:     Cervical back: Normal range of motion and neck supple.  Lymphadenopathy:     Cervical: No cervical adenopathy.     Upper Body:  No axillary adenopathy present. Skin:    General: Skin is warm, dry and intact.     Findings: No rash.  Neurological:     Mental Status: She is alert.     Cranial Nerves: No cranial nerve deficit.     Sensory: No sensory deficit.     Deep Tendon Reflexes: Strength normal.  Psychiatric:        Mood and Affect: Mood is not anxious or depressed.        Speech: Speech normal.        Behavior: Behavior normal. Behavior is cooperative.        Cognition and Memory: Cognition and memory normal.        Judgment: Judgment normal.      Assessment and Plan    The patient's preventative maintenance and recommended screening tests for an annual wellness exam were reviewed in full today. Brought up to date unless services declined.  Counselled on the importance of diet, exercise, and its role in overall health and mortality. The patient's FH and SH was reviewed, including their home life, tobacco status, and drug and alcohol status.   Vaccines: Due for tdap, given flu today.  nonsmoker  Mammo; 03/2015 nml  PAP/DVE: PAP 2014 nml,  Per GYN, has appt schedule STD screen: HIV: refused Hep C: neg 2015 Colon:01/2014, Dr. Fuller Plan, polyps sessile , 1 polyp adenomatous, plan repeat in 5 years.  Eliezer Lofts, MD

## 2020-06-11 NOTE — Patient Instructions (Addendum)
Call GI Dr. Fuller Plan to set colon cancer screening.  Please call the location of your choice from the menu below to schedule your Mammogram and/or Bone Density appointment.    [place]   1. [facility]                      Phone:  [phone] [address], [place] [address]                                                             Services: Traditional and 3D Mammogram, Bone Density   2. [place] [facility]                 Phone: [phone] [address], [place] [address]    Service: Bone Density ONLY   *this site does NOT perform mammograms  3. [place]                        Phone:  [phone] [address] [place], [place] [address]                                            Services:  3D Mammogram and Bone Density    [place]  1. [place] [facility]   Phone:  [phone]   [place], [place] [address]                                            Services: 3D Mammogram and Bone Density  2. [place] at [facility])  Phone:  [phone]   [address] [place], [place] [address]  Services:  3D Mammogram and Bone Density

## 2020-06-12 LAB — TSH: TSH: 1.04 mIU/L (ref 0.40–4.50)

## 2020-06-12 LAB — LIPID PANEL
Cholesterol: 192 mg/dL (ref <200)
HDL: 43 mg/dL — ABNORMAL LOW (ref >=50)
LDL Cholesterol (Calc): 111 mg/dL (calc) — ABNORMAL HIGH
Non-HDL Cholesterol (Calc): 149 mg/dL (calc) — ABNORMAL HIGH (ref <130)
Total CHOL/HDL Ratio: 4.5 (calc) (ref <5.0)
Triglycerides: 263 mg/dL — ABNORMAL HIGH (ref <150)

## 2020-06-12 LAB — CBC WITH DIFFERENTIAL/PLATELET
Absolute Monocytes: 917 cells/uL (ref 200–950)
Basophils Absolute: 18 cells/uL (ref 0–200)
Basophils Relative: 0.2 %
Eosinophils Absolute: 587 cells/uL — ABNORMAL HIGH (ref 15–500)
Eosinophils Relative: 6.6 %
HCT: 47 % — ABNORMAL HIGH (ref 35.0–45.0)
Hemoglobin: 15.9 g/dL — ABNORMAL HIGH (ref 11.7–15.5)
Lymphs Abs: 2394 cells/uL (ref 850–3900)
MCH: 29.6 pg (ref 27.0–33.0)
MCHC: 33.8 g/dL (ref 32.0–36.0)
MCV: 87.4 fL (ref 80.0–100.0)
MPV: 9.9 fL (ref 7.5–12.5)
Monocytes Relative: 10.3 %
Neutro Abs: 4984 cells/uL (ref 1500–7800)
Neutrophils Relative %: 56 %
Platelets: 286 10*3/uL (ref 140–400)
RBC: 5.38 10*6/uL — ABNORMAL HIGH (ref 3.80–5.10)
RDW: 12.3 % (ref 11.0–15.0)
Total Lymphocyte: 26.9 %
WBC: 8.9 10*3/uL (ref 3.8–10.8)

## 2020-06-12 LAB — COMPREHENSIVE METABOLIC PANEL
AG Ratio: 2.2 (calc) (ref 1.0–2.5)
ALT: 31 U/L — ABNORMAL HIGH (ref 6–29)
AST: 21 U/L (ref 10–35)
Albumin: 4.4 g/dL (ref 3.6–5.1)
Alkaline phosphatase (APISO): 80 U/L (ref 37–153)
BUN: 18 mg/dL (ref 7–25)
CO2: 28 mmol/L (ref 20–32)
Calcium: 9.8 mg/dL (ref 8.6–10.4)
Chloride: 102 mmol/L (ref 98–110)
Creat: 0.86 mg/dL (ref 0.50–1.05)
Globulin: 2 g/dL (calc) (ref 1.9–3.7)
Glucose, Bld: 95 mg/dL (ref 65–99)
Potassium: 3.9 mmol/L (ref 3.5–5.3)
Sodium: 142 mmol/L (ref 135–146)
Total Bilirubin: 0.6 mg/dL (ref 0.2–1.2)
Total Protein: 6.4 g/dL (ref 6.1–8.1)

## 2020-06-12 LAB — VITAMIN D 25 HYDROXY (VIT D DEFICIENCY, FRACTURES): Vit D, 25-Hydroxy: 43 ng/mL (ref 30–100)

## 2020-06-12 LAB — VITAMIN B12: Vitamin B-12: 347 pg/mL (ref 200–1100)

## 2020-06-16 ENCOUNTER — Other Ambulatory Visit: Payer: Self-pay

## 2020-06-16 ENCOUNTER — Other Ambulatory Visit: Payer: BC Managed Care – PPO

## 2020-06-28 ENCOUNTER — Encounter: Payer: Self-pay | Admitting: Gastroenterology

## 2020-06-29 ENCOUNTER — Telehealth: Payer: Self-pay | Admitting: Family Medicine

## 2020-06-29 DIAGNOSIS — Z1211 Encounter for screening for malignant neoplasm of colon: Secondary | ICD-10-CM

## 2020-06-29 NOTE — Telephone Encounter (Signed)
Referral sent 

## 2020-06-29 NOTE — Telephone Encounter (Signed)
Pt sent a message on myychart , and she stated Would you please send a  COLONOSCOPY referral to Dr. Kennedy Bucker, [place], [place]

## 2020-08-11 ENCOUNTER — Other Ambulatory Visit: Payer: Self-pay

## 2020-08-11 ENCOUNTER — Ambulatory Visit (AMBULATORY_SURGERY_CENTER): Payer: Self-pay | Admitting: *Deleted

## 2020-08-11 VITALS — Ht 65.0 in | Wt 180.0 lb

## 2020-08-11 DIAGNOSIS — Z860100 Personal history of colon polyps, unspecified: Secondary | ICD-10-CM

## 2020-08-11 DIAGNOSIS — R5383 Other fatigue: Secondary | ICD-10-CM | POA: Insufficient documentation

## 2020-08-11 MED ORDER — NA SULFATE-K SULFATE-MG SULF 17.5-3.13-1.6 GM/177ML PO SOLN: 1.0000 | Freq: Once | ORAL | 0 refills | Status: AC

## 2020-08-11 NOTE — Assessment & Plan Note (Signed)
Borderline control despite lisinopril low dose. Follow BPs at home and send in measurements.

## 2020-08-11 NOTE — Progress Notes (Signed)

## 2020-08-11 NOTE — Assessment & Plan Note (Signed)
Eval with labs. Encouraged exercise, weight loss, healthy eating habits.  

## 2020-08-20 ENCOUNTER — Telehealth: Payer: Self-pay | Admitting: Gastroenterology

## 2020-08-20 ENCOUNTER — Encounter: Payer: Self-pay | Admitting: Gastroenterology

## 2020-08-20 MED ORDER — SUPREP BOWEL PREP KIT 17.5-3.13-1.6 GM/177ML PO SOLN: 1.0000 | Freq: Once | ORAL | 0 refills | Status: AC

## 2020-08-20 NOTE — Telephone Encounter (Signed)
Resent Suprep to requested Pharmacy

## 2020-08-20 NOTE — Telephone Encounter (Signed)
Pt is requesting for the Suprep to be called in to the pharmacy.  [place]

## 2020-08-25 ENCOUNTER — Telehealth: Payer: Self-pay | Admitting: Gastroenterology

## 2020-08-25 NOTE — Telephone Encounter (Signed)
Joy Yates

## 2020-08-25 NOTE — Telephone Encounter (Signed)
Please charge for late [facility] cancellation.  ?

## 2020-08-25 NOTE — Telephone Encounter (Signed)
Good morning Dr. Fuller Plan, this patient  Called and cancelled her procedure scheduled for tomorrow.  States she can not afford it at this time.  She will call back in September to reschedule.

## 2020-08-26 ENCOUNTER — Encounter: Payer: BC Managed Care – PPO | Admitting: Gastroenterology

## 2020-10-07 ENCOUNTER — Encounter: Payer: Self-pay | Admitting: Radiology

## 2020-11-18 NOTE — Telephone Encounter (Signed)
Mandy.. do you have an resources for this  " friend" of my patient? Or who should I ask?... someone at [facility]? There used to be the [employer] in [place] and [place] in [place] but I am not sure if they are still around?

## 2020-12-02 NOTE — Telephone Encounter (Signed)
We could put in a referral for social work through [facility] but we are not sure how long that may take. I know [facility] ([facility]) is a low income/uninsured/underinsured clinic which has a few locations in [place], [place] and [place]. They may have a few more now. The person would need to actually go into the clinic himself and fill out the paperwork requesting to be accepted as a pt there. But they would accept him since he is uninsured. If he is interested in that I can give them the number and locations. Or we cold try a referral for social worker.

## 2020-12-02 NOTE — Telephone Encounter (Signed)
Joy [place],  Do you think this is someone we could connect with our social worker to reach out to?

## 2020-12-03 ENCOUNTER — Ambulatory Visit: Payer: BC Managed Care – PPO | Admitting: Family Medicine

## 2020-12-03 NOTE — Telephone Encounter (Signed)
Pt called and was advised of [facility], [phone], and the services they offer. Advised if any problems or anything else is needed to contact this clinic. Pt was very appreciative and verbalized understanding.

## 2020-12-03 NOTE — Telephone Encounter (Signed)
Tried to contact pt but no answer and VM not set up yet. Also tried to contact pt's husband but not answer and no VM set up yet. Sent mychart msg.

## 2021-01-16 ENCOUNTER — Other Ambulatory Visit: Payer: Self-pay | Admitting: Family Medicine

## 2021-01-21 ENCOUNTER — Telehealth: Payer: Self-pay | Admitting: Family Medicine

## 2021-01-21 NOTE — Telephone Encounter (Signed)
Patient call in to schedule virtual visit . There was none available in office .advise patient to go on [facility] online to schedule

## 2021-02-10 IMAGING — MG DIGITAL SCREENING BILAT W/ CAD
4 series · 4 of 4 positions shown · non-contrast
Comparison: Previous exam(s).

CLINICAL DATA: Screening.

EXAM:
DIGITAL SCREENING BILATERAL MAMMOGRAM WITH CAD

[L CC]
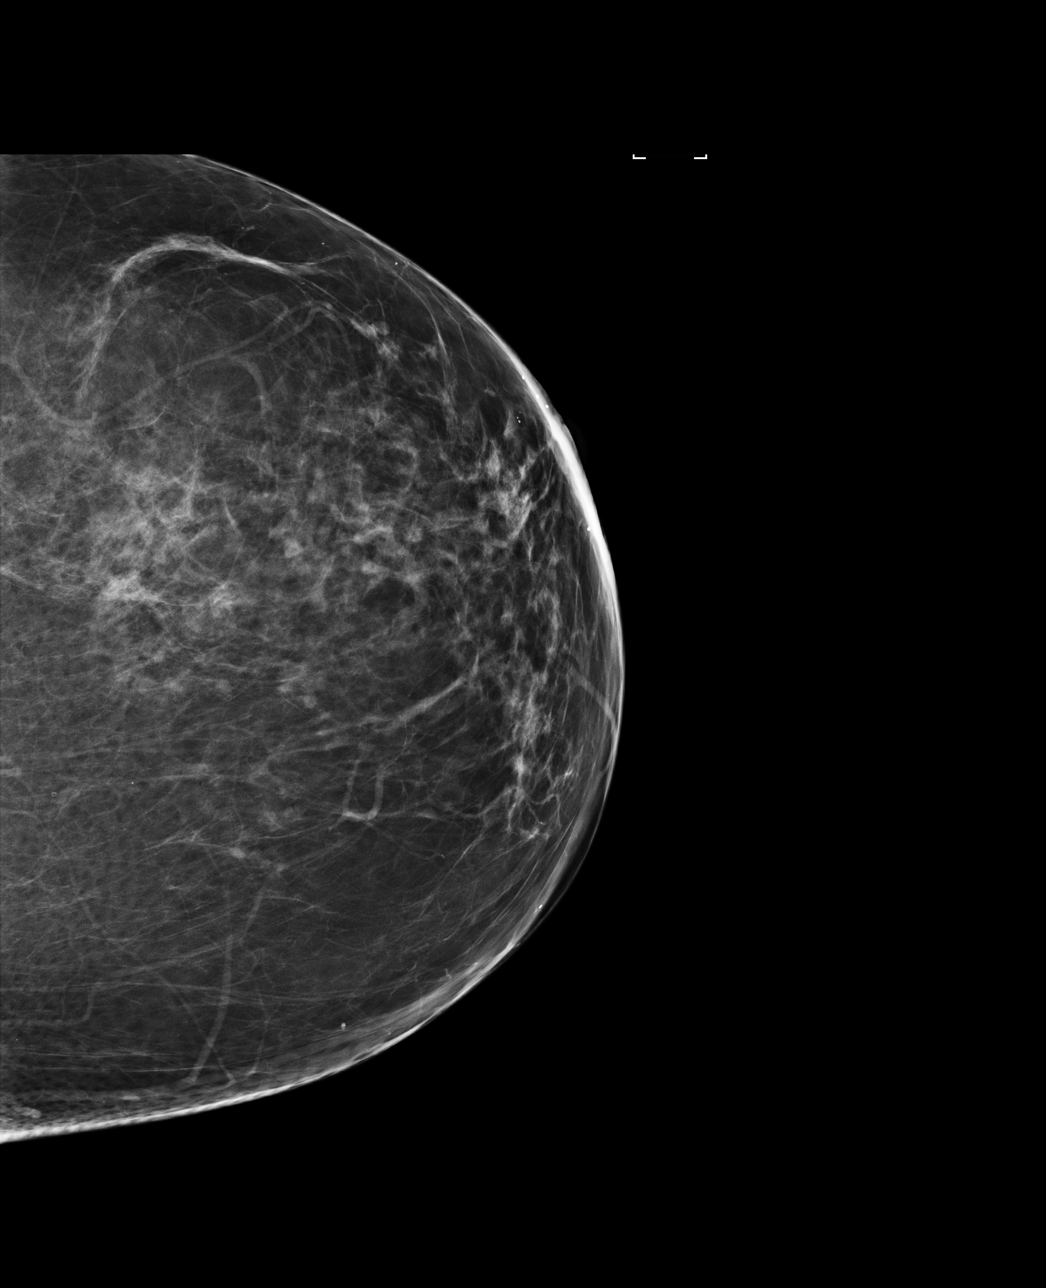

[R MLO]
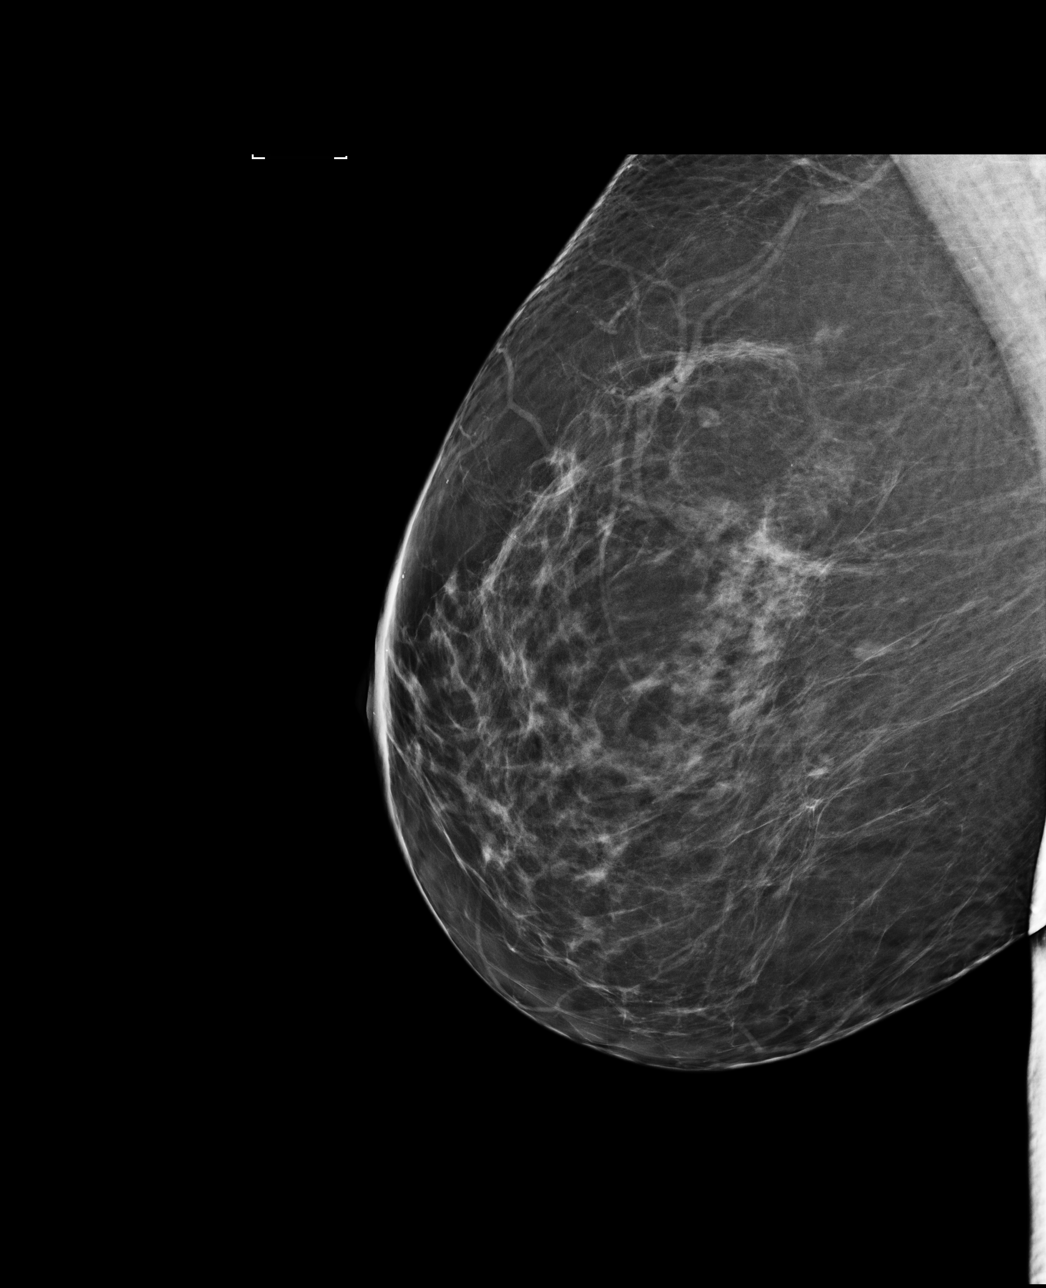

[R CC]
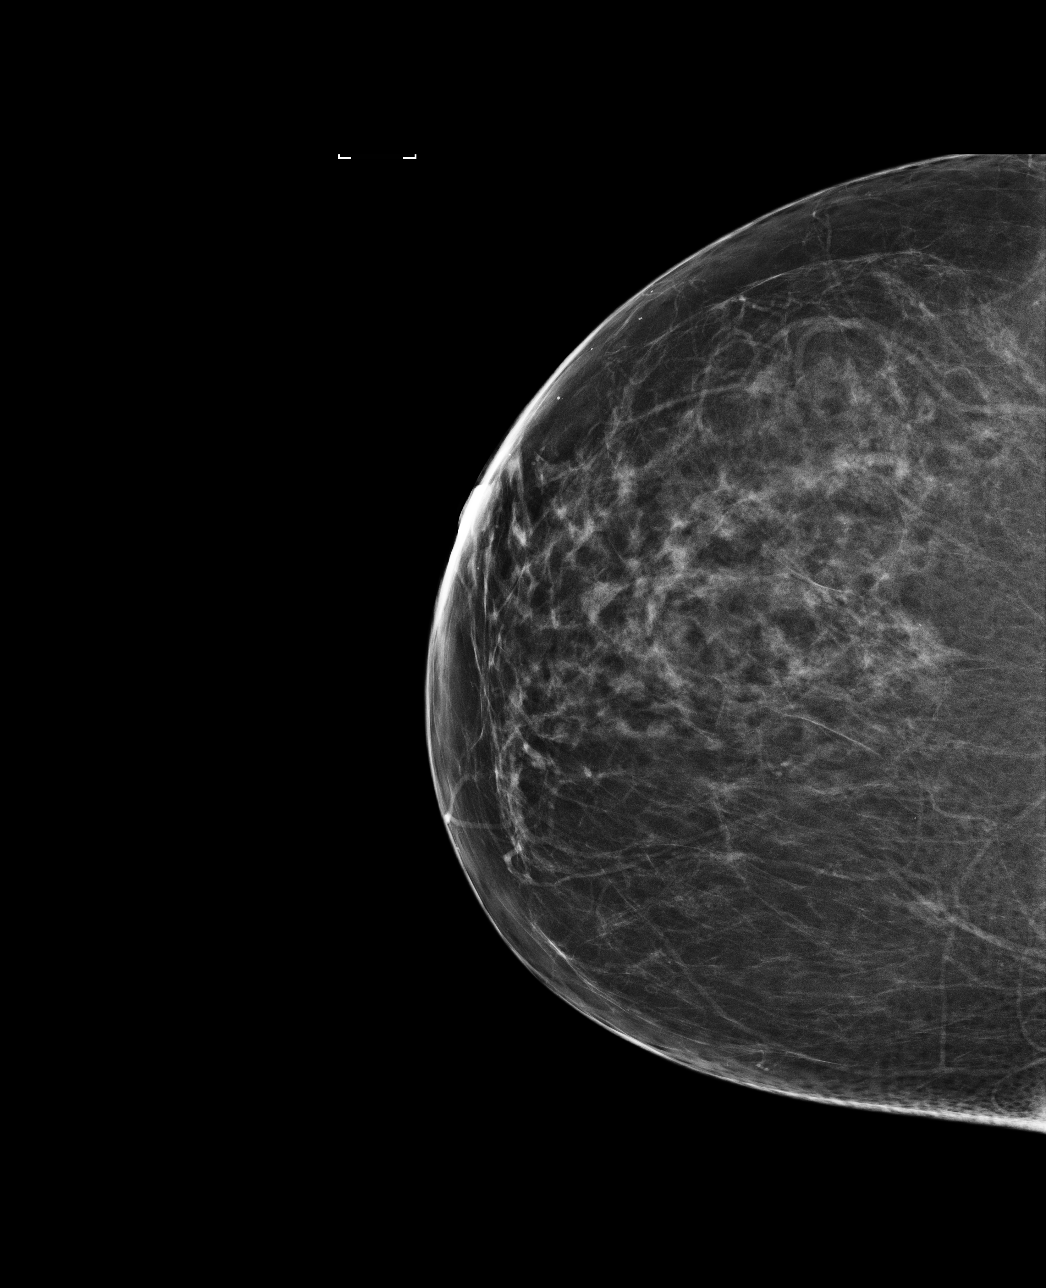

[L MLO]
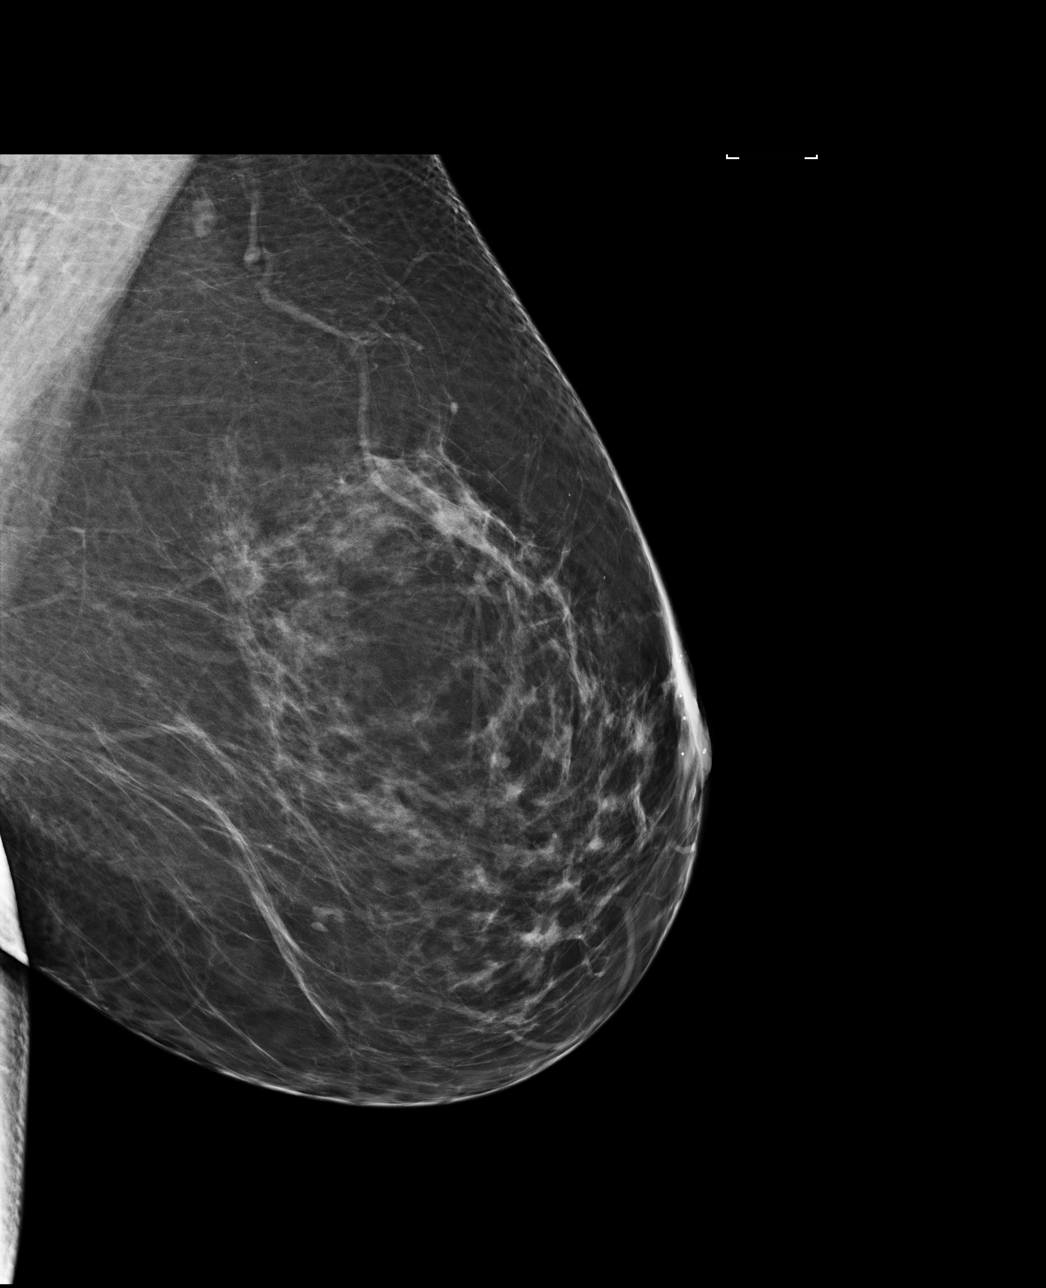

[4 of 4 positions shown; findings below may reference images not displayed]

ACR Breast Density Category b: There are scattered areas of
fibroglandular density.
FINDINGS: There are no findings suspicious for malignancy. Images were
processed with CAD.
IMPRESSION: No mammographic evidence of malignancy. A result letter of this
screening mammogram will be mailed directly to the patient.

RECOMMENDATION:
Screening mammogram in one year. (Code:AS-G-LCT)

BI-RADS CATEGORY  1: Negative.

## 2021-03-01 ENCOUNTER — Ambulatory Visit: Payer: BC Managed Care – PPO | Admitting: Family Medicine

## 2021-03-22 ENCOUNTER — Other Ambulatory Visit: Payer: Self-pay

## 2021-03-22 ENCOUNTER — Encounter: Payer: Self-pay | Admitting: Family Medicine

## 2021-03-22 ENCOUNTER — Ambulatory Visit (INDEPENDENT_AMBULATORY_CARE_PROVIDER_SITE_OTHER): Payer: BC Managed Care – PPO | Admitting: Family Medicine

## 2021-03-22 DIAGNOSIS — E785 Hyperlipidemia, unspecified: Secondary | ICD-10-CM | POA: Diagnosis not present

## 2021-03-22 DIAGNOSIS — I10 Essential (primary) hypertension: Secondary | ICD-10-CM | POA: Diagnosis not present

## 2021-03-22 DIAGNOSIS — Z8249 Family history of ischemic heart disease and other diseases of the circulatory system: Secondary | ICD-10-CM

## 2021-03-22 DIAGNOSIS — D751 Secondary polycythemia: Secondary | ICD-10-CM

## 2021-03-22 MED ORDER — ZOSTER VAC RECOMB ADJUVANTED 50 MCG/0.5ML IM SUSR: INTRAMUSCULAR | 0 refills | Status: DC

## 2021-03-22 NOTE — Progress Notes (Signed)
    SUBJECTIVE:   CHIEF COMPLAINT / HPI:   First visit to [facility].  Her husband saw me last week.  Overall feels well.  Hypertension Takes lisinopril 2.5 mg 3 x per week.  If takes more frequently feels bad - trouble thinking.  Last took yesterday. No chest pain or shortness of breath or edema.  Brought her blood pressure cuff today  Cholesterol Takes atorvastatin with Q10 about 2 times a week  Polycythemia  Has known elevated hgb for years.  Never seen by a hematologist.  No history of blood clots.  Does not smoke   PERTINENT  PMH / PSH:  Mother - history of brain aneurysm in 2009 Aunt history of brain tumor as a child   OBJECTIVE:   BP (!) 153/94   Pulse 71   Ht '5\' 6"'$  (1.676 m)   Wt 178 lb 9.6 oz (81 kg)   SpO2 99%   BMI 28.83 kg/m   Heart - Regular rate and rhythm.  No murmurs, gallops or rubs.    Lungs:  Normal respiratory effort, chest expands symmetrically. Lungs are clear to auscultation, no crackles or wheezes. Extremities:  No cyanosis, edema, or deformity noted with good range of motion of all major joints.   Repeat blood pressure using her cuff was with in 2 points of our measurement  ASSESSMENT/PLAN:   Essential hypertension, benign Not well controlled.  Asked to take lisinopril every day and monitor blood pressure at home.  Communicate with me via MyChart.  Will repeat labs next visit  Polycythemia Mild asymptomatic.  Will recheck labs next visit  Hyperlipidemia Stable.  Taking statin twice a week.  Will check control next visit.    Family history of brain aneurysm She will investigate type from [place], [place]

## 2021-03-22 NOTE — Patient Instructions (Addendum)
Good to see you today - Thank you for coming in  Things we discussed today:  Your goal blood pressure is less than 140/90.  Check your blood pressure several times a week.  If regularly higher than this please let me know - either with MyChart or leaving a phone message. Next visit please bring in your blood pressure cuff.    Take your lisinopril every night at the same  See if you can find out what type of aneurysm your mother had   Contact Dr Fuller Plan for your colonoscopy when you would like  I sent in your second Shingrix shot   Please always bring your medication bottles  Come back to see me in 4 months

## 2021-03-23 ENCOUNTER — Encounter: Payer: Self-pay | Admitting: Family Medicine

## 2021-03-23 DIAGNOSIS — E785 Hyperlipidemia, unspecified: Secondary | ICD-10-CM | POA: Insufficient documentation

## 2021-03-23 DIAGNOSIS — Z8249 Family history of ischemic heart disease and other diseases of the circulatory system: Secondary | ICD-10-CM | POA: Insufficient documentation

## 2021-03-23 DIAGNOSIS — D751 Secondary polycythemia: Secondary | ICD-10-CM | POA: Insufficient documentation

## 2021-03-23 NOTE — Assessment & Plan Note (Signed)
Stable.  Taking statin twice a week.  Will check control next visit.

## 2021-03-23 NOTE — Assessment & Plan Note (Signed)
She will investigate type from [facility] records

## 2021-03-23 NOTE — Assessment & Plan Note (Signed)
Not well controlled.  Asked to take lisinopril every day and monitor blood pressure at home.  Communicate with me via MyChart.  Will repeat labs next visit

## 2021-03-23 NOTE — Assessment & Plan Note (Signed)
Mild asymptomatic.  Will recheck labs next visit

## 2021-04-13 ENCOUNTER — Other Ambulatory Visit: Payer: Self-pay | Admitting: Family Medicine

## 2021-04-15 ENCOUNTER — Encounter: Payer: Self-pay | Admitting: Family Medicine

## 2021-06-19 IMAGING — US US PELVIS COMPLETE WITH TRANSVAGINAL
1 series · 15 of 25 positions shown · non-contrast
Comparison: None available.

CLINICAL DATA: Initial evaluation for left lower quadrant
discomfort.



[Series 1: us pelvis complete with transvaginal · 75 acquisitions, 15 frames shown]
[im 1/75]
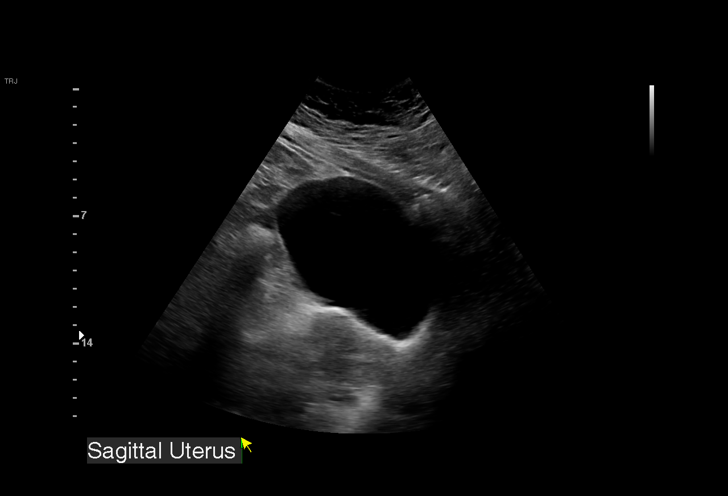
[im 7/75]
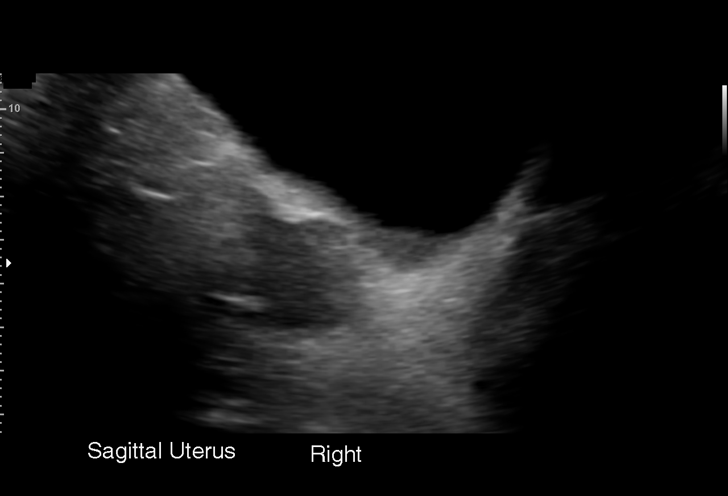
[im 13/75]
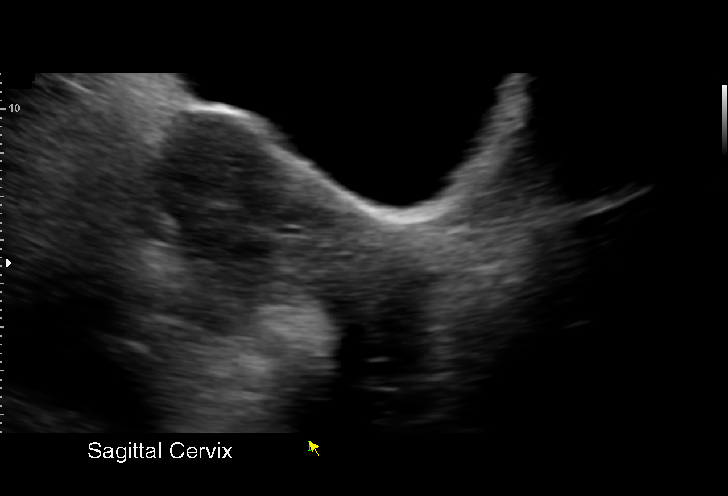
[im 16/75]
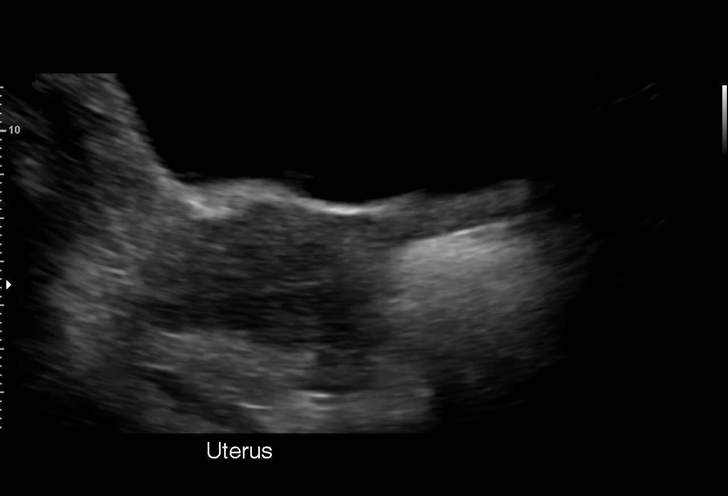
[im 22/75]
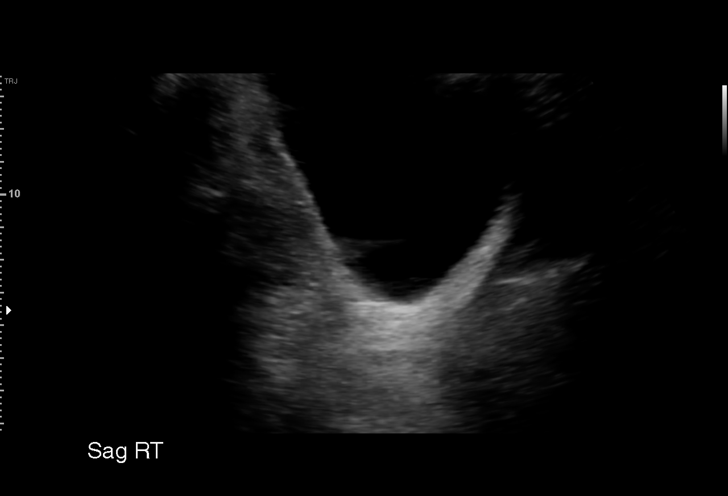
[im 28/75]
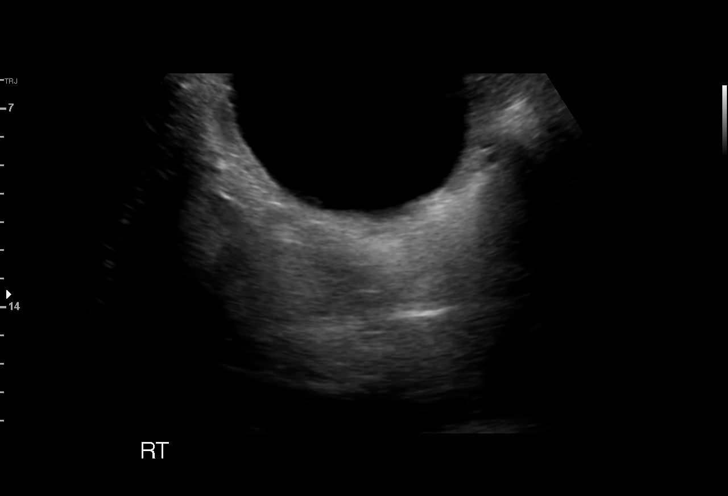
[im 31/75]
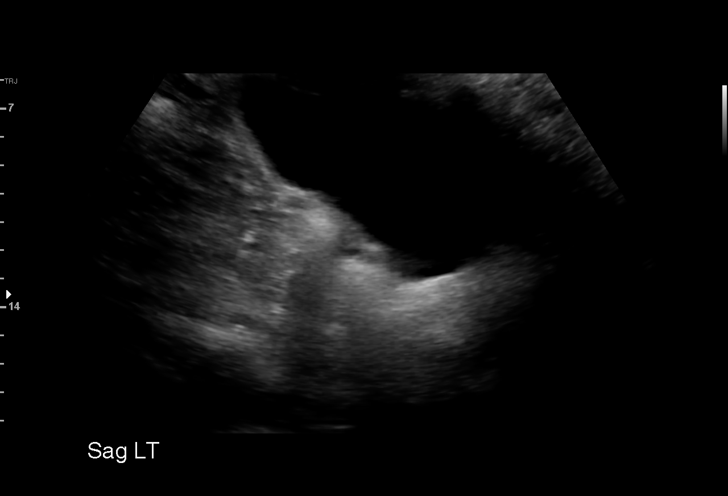
[im 38/75]
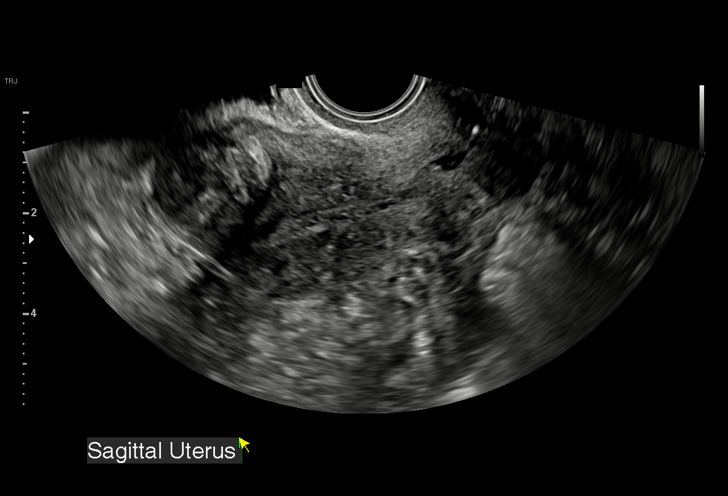
[im 44/75]
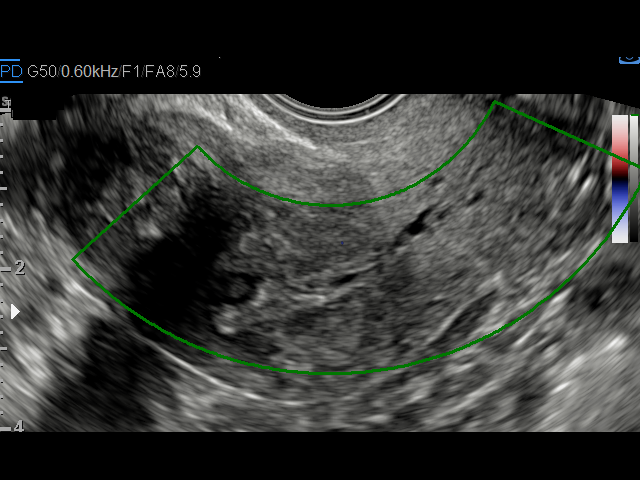
[im 47/75]
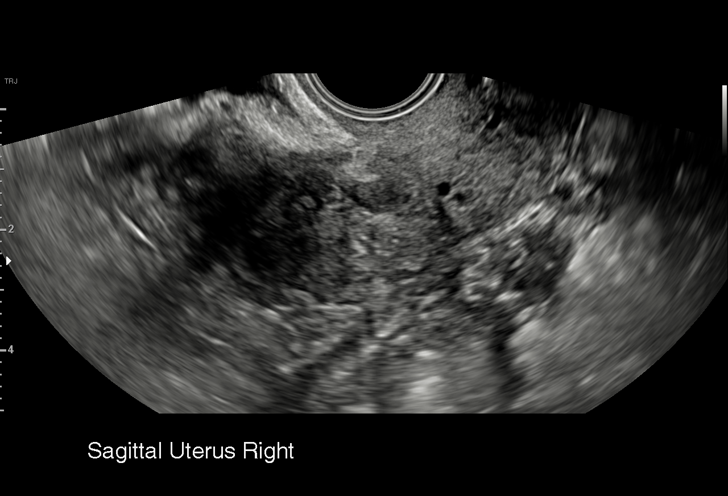
[im 53/75]
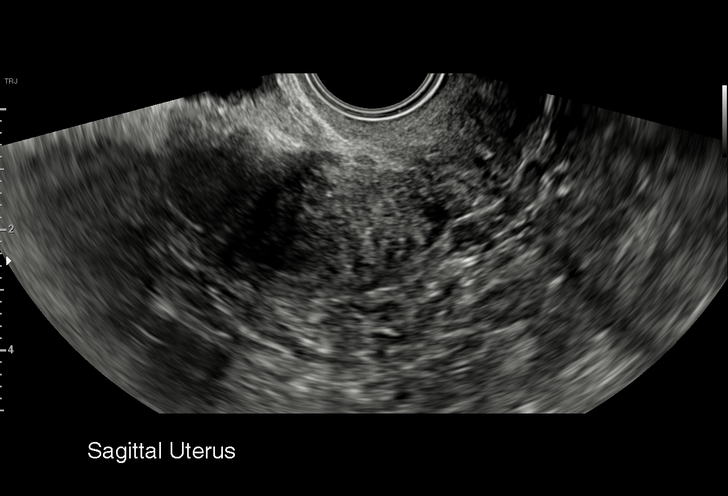
[im 59/75]
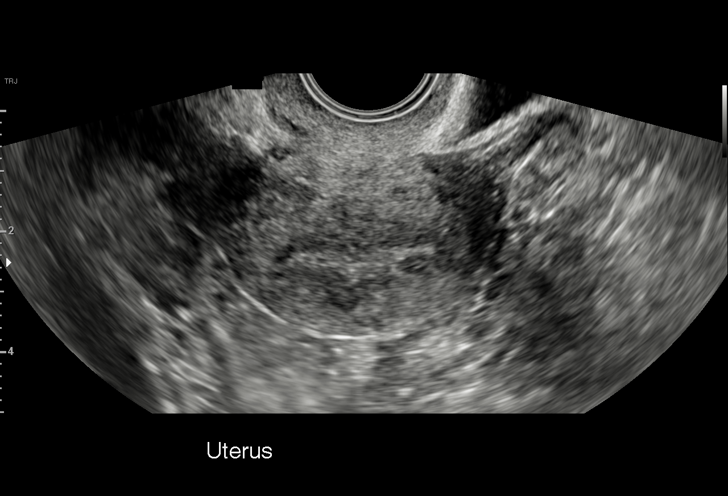
[im 62/75]
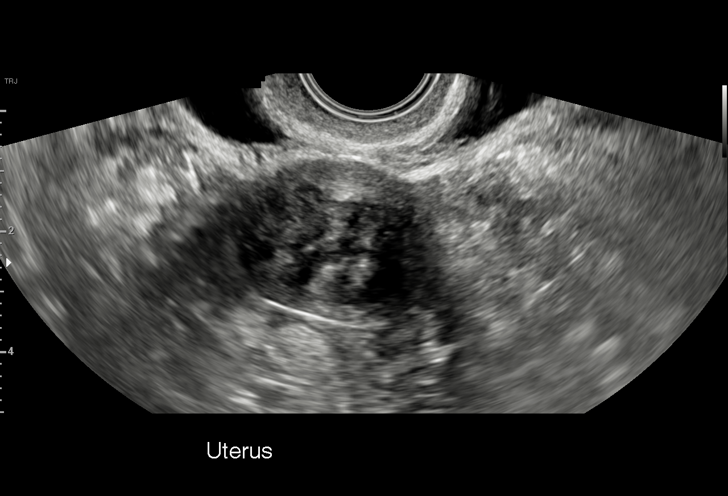
[im 68/75]
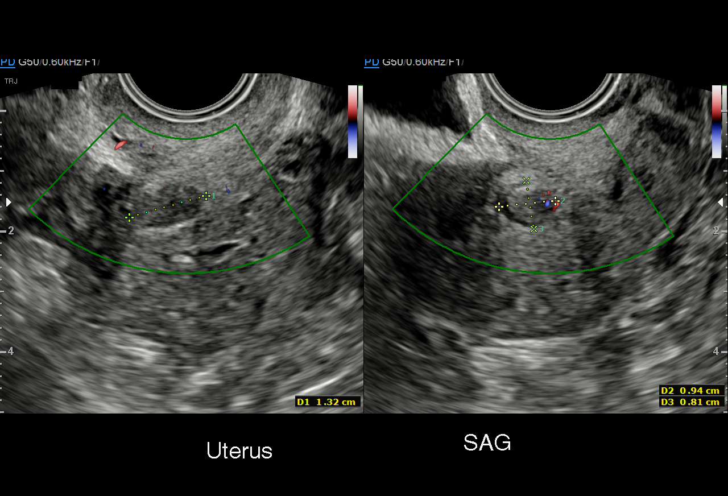
[im 75/75]
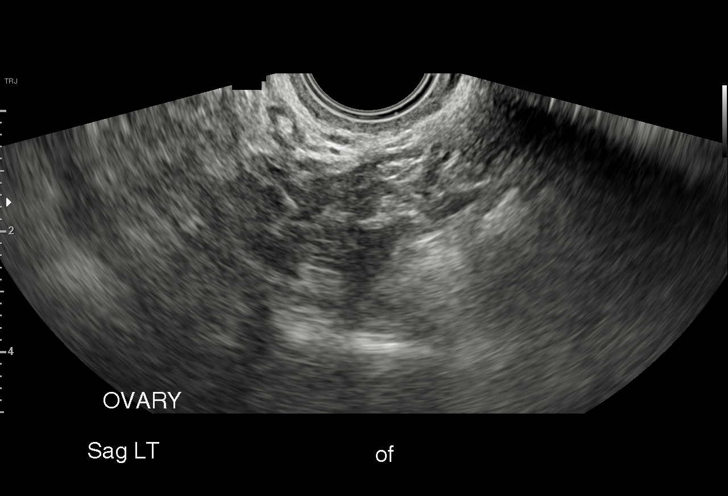

[15 of 25 positions shown; findings below may reference images not displayed]

FINDINGS: Uterus

Measurements: 7.7 x 3.4 x 4.3 cm = volume: 58.6 mL. Heterogeneous
echotexture seen throughout the uterine myometrium. 2.8 x 2.4 x
cm exophytic fibroid seen extending from the anterior margin of the
uterine fundus. Additional 1.3 x 0.8 x 0.9 cm intramural fibroid
present at the right anterior mid uterine body.

Endometrium

Thickness: 5 mm. Endometrial complex mildly heterogeneous without
discrete lesion or mass. Small amount of simple anechoic fluid
present within the endometrial cavity.

Right ovary

Measurements: 2.1 x 1.5 x 1.5 cm = volume: 2.5 mL. Normal
appearance/no adnexal mass.

Left ovary

Measurements: 2.2 x 1.5 x 1.4 cm = volume: 2.4 mL. Normal
appearance/no adnexal mass.

Other findings

No abnormal free fluid.
IMPRESSION: 1. Fibroid uterus as detailed above.
2. Otherwise unremarkable pelvic ultrasound, with normal sonographic
appearance of the ovaries. No adnexal mass or other abnormality to
explain patient's symptoms identified.

## 2021-06-20 NOTE — Patient Instructions (Signed)
Good to see you today - Thank you for coming in  Things we discussed today:  Your goal blood pressure is less than 140/90.  Check your blood pressure several times a week.  If regularly higher than this please let me know - either with MyChart or leaving a phone message. Next visit please bring in your blood pressure cuff.     You need a colonoscopy - you plan get to get in May 2023  Walk or exercise daily  I will call you if your tests are not good.  Otherwise, I will send you a message on MyChart (if it is active) or a letter in the mail..  If you do not hear from me with in 2 weeks please call our office.     Please always bring your medication bottles  Come back to see me in 6 months

## 2021-06-20 NOTE — Progress Notes (Signed)
    SUBJECTIVE:   CHIEF COMPLAINT / HPI:   Essential hypertension, benign Taking lisinopril 2.5 mg daily.  Had tried 5 mg which seemed to work but didn't like it.  Home readings 145-160s/82-95s    Hyperlipidemia Taking statin once a week  Does not want to take more often but will if needs to depending on blood tests     PERTINENT  PMH / PSH: had recent tooth extraction  OBJECTIVE:   BP (!) 171/99   Pulse 68   Ht 5\' 6"  (1.676 m)   Wt 183 lb 3.2 oz (83.1 kg)   SpO2 100%   BMI 29.57 kg/m   Heart - Regular rate and rhythm.  No murmurs, gallops or rubs.    Lungs:  Normal respiratory effort, chest expands symmetrically. Lungs are clear to auscultation, no crackles or wheezes. Abdomen: soft and non-tender without masses, organomegaly or hernias noted.  No guarding or rebound Extremities:  No cyanosis, edema, or deformity noted with good range of motion of all major joints.   Mobility:able to get up and down from exam table without assistance or distress   ASSESSMENT/PLAN:   Essential hypertension, benign Not at goal.  Stop lisinopril and start low dose losartan hctz as per her request.   Check labs   Hyperlipidemia On very infrequent statin.  Check labs today      Lind Covert, MD [place]

## 2021-06-21 ENCOUNTER — Ambulatory Visit (INDEPENDENT_AMBULATORY_CARE_PROVIDER_SITE_OTHER): Payer: BC Managed Care – PPO | Admitting: Family Medicine

## 2021-06-21 ENCOUNTER — Other Ambulatory Visit: Payer: Self-pay

## 2021-06-21 ENCOUNTER — Ambulatory Visit (INDEPENDENT_AMBULATORY_CARE_PROVIDER_SITE_OTHER): Payer: BC Managed Care – PPO

## 2021-06-21 DIAGNOSIS — D751 Secondary polycythemia: Secondary | ICD-10-CM

## 2021-06-21 DIAGNOSIS — E785 Hyperlipidemia, unspecified: Secondary | ICD-10-CM

## 2021-06-21 DIAGNOSIS — I10 Essential (primary) hypertension: Secondary | ICD-10-CM | POA: Diagnosis not present

## 2021-06-21 DIAGNOSIS — Z23 Encounter for immunization: Secondary | ICD-10-CM

## 2021-06-21 MED ORDER — LOSARTAN POTASSIUM-HCTZ 50-12.5 MG PO TABS: 1.0000 | ORAL_TABLET | Freq: Every day | ORAL | 3 refills | Status: DC

## 2021-06-21 NOTE — Assessment & Plan Note (Signed)
On very infrequent statin.  Check labs today

## 2021-06-21 NOTE — Assessment & Plan Note (Signed)
Not at goal.  Stop lisinopril and start low dose losartan hctz as per her request.   Check labs

## 2021-06-22 LAB — CBC WITH DIFFERENTIAL/PLATELET
Basophils Absolute: 0 10*3/uL (ref 0.0–0.2)
Basos: 0 %
EOS (ABSOLUTE): 0.5 10*3/uL — ABNORMAL HIGH (ref 0.0–0.4)
Eos: 7 %
Hematocrit: 46.5 % (ref 34.0–46.6)
Hemoglobin: 16.2 g/dL — ABNORMAL HIGH (ref 11.1–15.9)
Immature Grans (Abs): 0 10*3/uL (ref 0.0–0.1)
Immature Granulocytes: 0 %
Lymphocytes Absolute: 2.3 10*3/uL (ref 0.7–3.1)
Lymphs: 32 %
MCH: 29.7 pg (ref 26.6–33.0)
MCHC: 34.8 g/dL (ref 31.5–35.7)
MCV: 85 fL (ref 79–97)
Monocytes Absolute: 0.7 10*3/uL (ref 0.1–0.9)
Monocytes: 10 %
Neutrophils Absolute: 3.8 10*3/uL (ref 1.4–7.0)
Neutrophils: 51 %
Platelets: 287 10*3/uL (ref 150–450)
RBC: 5.45 x10E6/uL — ABNORMAL HIGH (ref 3.77–5.28)
RDW: 12.4 % (ref 11.7–15.4)
WBC: 7.4 10*3/uL (ref 3.4–10.8)

## 2021-06-22 LAB — CMP14+EGFR
ALT: 32 IU/L (ref 0–32)
AST: 23 IU/L (ref 0–40)
Albumin/Globulin Ratio: 2.6 — ABNORMAL HIGH (ref 1.2–2.2)
Albumin: 4.7 g/dL (ref 3.8–4.9)
Alkaline Phosphatase: 85 IU/L (ref 44–121)
BUN/Creatinine Ratio: 15 (ref 9–23)
BUN: 11 mg/dL (ref 6–24)
Bilirubin Total: 0.8 mg/dL (ref 0.0–1.2)
CO2: 28 mmol/L (ref 20–29)
Calcium: 9.9 mg/dL (ref 8.7–10.2)
Chloride: 103 mmol/L (ref 96–106)
Creatinine, Ser: 0.71 mg/dL (ref 0.57–1.00)
Globulin, Total: 1.8 g/dL (ref 1.5–4.5)
Glucose: 103 mg/dL — ABNORMAL HIGH (ref 70–99)
Potassium: 4.8 mmol/L (ref 3.5–5.2)
Sodium: 143 mmol/L (ref 134–144)
Total Protein: 6.5 g/dL (ref 6.0–8.5)
eGFR: 98 mL/min/{1.73_m2} (ref >59)

## 2021-06-22 LAB — LIPID PANEL
Chol/HDL Ratio: 4 ratio (ref 0.0–4.4)
Cholesterol, Total: 198 mg/dL (ref 100–199)
HDL: 49 mg/dL (ref >39)
LDL Chol Calc (NIH): 123 mg/dL — ABNORMAL HIGH (ref 0–99)
Triglycerides: 145 mg/dL (ref 0–149)
VLDL Cholesterol Cal: 26 mg/dL (ref 5–40)

## 2021-06-22 NOTE — Assessment & Plan Note (Addendum)
Cbc shows continued mild polycythemia with no increase over last few years.  Pulse Ox 100% at rest.  No signs of renal disease or chronic hypoxia.  Unsure if further work up would be useful  Will discuss with her.  Consider ambulatory pulseox and  EPO measurement next visit.

## 2021-09-23 ENCOUNTER — Encounter: Payer: Self-pay | Admitting: Family Medicine

## 2021-09-26 MED ORDER — LISINOPRIL 2.5 MG PO TABS: 2.5000 mg | ORAL_TABLET | Freq: Every day | ORAL | 1 refills | Status: DC

## 2021-11-08 ENCOUNTER — Encounter: Payer: Self-pay | Admitting: Gastroenterology

## 2021-12-14 ENCOUNTER — Ambulatory Visit (AMBULATORY_SURGERY_CENTER): Payer: BC Managed Care – PPO | Admitting: *Deleted

## 2021-12-14 VITALS — Ht 66.0 in | Wt 183.0 lb

## 2021-12-14 DIAGNOSIS — Z860100 Personal history of colon polyps, unspecified: Secondary | ICD-10-CM

## 2021-12-14 MED ORDER — NA SULFATE-K SULFATE-MG SULF 17.5-3.13-1.6 GM/177ML PO SOLN: 1.0000 | Freq: Once | ORAL | 0 refills | Status: AC

## 2021-12-14 NOTE — Progress Notes (Signed)

## 2021-12-22 ENCOUNTER — Other Ambulatory Visit: Payer: Self-pay | Admitting: Family Medicine

## 2021-12-30 ENCOUNTER — Encounter: Payer: Self-pay | Admitting: Gastroenterology

## 2022-01-04 ENCOUNTER — Ambulatory Visit (AMBULATORY_SURGERY_CENTER): Payer: BC Managed Care – PPO | Admitting: Gastroenterology

## 2022-01-04 ENCOUNTER — Encounter: Payer: Self-pay | Admitting: Gastroenterology

## 2022-01-04 VITALS — BP 151/88 | HR 66 | Temp 98.9°F | Resp 13 | Ht 66.0 in | Wt 183.0 lb

## 2022-01-04 DIAGNOSIS — K635 Polyp of colon: Secondary | ICD-10-CM

## 2022-01-04 DIAGNOSIS — Z860100 Personal history of colon polyps, unspecified: Secondary | ICD-10-CM

## 2022-01-04 DIAGNOSIS — D120 Benign neoplasm of cecum: Secondary | ICD-10-CM

## 2022-01-04 DIAGNOSIS — Z1211 Encounter for screening for malignant neoplasm of colon: Secondary | ICD-10-CM | POA: Diagnosis not present

## 2022-01-04 DIAGNOSIS — D123 Benign neoplasm of transverse colon: Secondary | ICD-10-CM

## 2022-01-04 DIAGNOSIS — D125 Benign neoplasm of sigmoid colon: Secondary | ICD-10-CM

## 2022-01-04 DIAGNOSIS — D122 Benign neoplasm of ascending colon: Secondary | ICD-10-CM

## 2022-01-04 MED ORDER — SODIUM CHLORIDE 0.9 % IV SOLN: 500.0000 mL | Freq: Once | INTRAVENOUS | Status: DC

## 2022-01-04 NOTE — Op Note (Signed)
[facility] Patient Name: Joy Yates Procedure Date: 01/04/2022 10:46 AM MRN: [id number] Endoscopist: Ladene Artist , MD Age: 61 Referring MD:  Date of Birth: 07-15-1961 Gender: Female Account #: [id number] Procedure:                Colonoscopy Indications:              High risk colon cancer surveillance: Personal                            history of sessile serrated colon polyp (less than                            10 mm in size) with no dysplasia Medicines:                Monitored Anesthesia Care Procedure:                Pre-Anesthesia Assessment:                           - Prior to the procedure, a History and Physical                            was performed, and patient medications and                            allergies were reviewed. The patient's tolerance of                            previous anesthesia was also reviewed. The risks                            and benefits of the procedure and the sedation                            options and risks were discussed with the patient.                            All questions were answered, and informed consent                            was obtained. Prior Anticoagulants: The patient has                            taken no previous anticoagulant or antiplatelet                            agents. ASA Grade Assessment: II - A patient with                            mild systemic disease. After reviewing the risks                            and benefits, the patient was deemed in  satisfactory condition to undergo the procedure.                           After obtaining informed consent, the colonoscope                            was passed under direct vision. Throughout the                            procedure, the patient's blood pressure, pulse, and                            oxygen saturations were monitored continuously. The                            Colonoscope was introduced  through the anus and                            advanced to the the cecum, identified by                            appendiceal orifice and ileocecal valve. The                            ileocecal valve, appendiceal orifice, and rectum                            were photographed. The quality of the bowel                            preparation was excellent. The colonoscopy was                            performed without difficulty. The patient tolerated                            the procedure well. Scope In: 11:01:23 AM Scope Out: 11:20:31 AM Scope Withdrawal Time: 0 hours 15 minutes 12 seconds  Total Procedure Duration: 0 hours 19 minutes 8 seconds  Findings:                 A 3 mm polyp was found in the cecum. The polyp was                            sessile. The polyp was removed with a cold biopsy                            forceps. Resection and retrieval were complete.                           Three sessile polyps were found in the descending                            colon, transverse colon and ascending colon. The  polyps were 7 to 8 mm in size. These polyps were                            removed with a cold snare. Resection and retrieval                            were complete.                           Skin tags were found on perianal exam.                           The exam was otherwise without abnormality on                            direct and retroflexion views. Complications:            No immediate complications. Estimated blood loss:                            None. Estimated Blood Loss:     Estimated blood loss: none. Impression:               - One 3 mm polyp in the cecum, removed with a cold                            biopsy forceps. Resected and retrieved.                           - Three 7 to 8 mm polyps in the descending colon,                            in the transverse colon and in the ascending colon,                             removed with a cold snare. Resected and retrieved.                           - Perianal skin tags found on perianal exam. Recommendation:           - Repeat colonoscopy after studies are complete for                            surveillance based on pathology results.                           - Patient has a contact number available for                            emergencies. The signs and symptoms of potential                            delayed complications were discussed with the  patient. Return to normal activities tomorrow.                            Written discharge instructions were provided to the                            patient.                           - Resume previous diet.                           - Continue present medications.                           - Await pathology results. Ladene Artist, MD 01/04/2022 11:24:14 AM This report has been signed electronically.

## 2022-01-04 NOTE — Patient Instructions (Signed)
Handouts Provided:  Polyps  YOU HAD AN ENDOSCOPIC PROCEDURE TODAY AT THE [facility]:   Refer to the procedure report that was given to you for any specific questions about what was found during the examination.  If the procedure report does not answer your questions, please call your gastroenterologist to clarify.  If you requested that your care partner not be given the details of your procedure findings, then the procedure report has been included in a sealed envelope for you to review at your convenience later.  YOU SHOULD EXPECT: Some feelings of bloating in the abdomen. Passage of more gas than usual.  Walking can help get rid of the air that was put into your GI tract during the procedure and reduce the bloating. If you had a lower endoscopy (such as a colonoscopy or flexible sigmoidoscopy) you may notice spotting of blood in your stool or on the toilet paper. If you underwent a bowel prep for your procedure, you may not have a normal bowel movement for a few days.  Please Note:  You might notice some irritation and congestion in your nose or some drainage.  This is from the oxygen used during your procedure.  There is no need for concern and it should clear up in a day or so.  SYMPTOMS TO REPORT IMMEDIATELY:   Following lower endoscopy (colonoscopy or flexible sigmoidoscopy):  Excessive amounts of blood in the stool  Significant tenderness or worsening of abdominal pains  Swelling of the abdomen that is new, acute  Fever of 100F or higher  For urgent or emergent issues, a gastroenterologist can be reached at any hour by calling [phone]. Do not use MyChart messaging for urgent concerns.    DIET:  We do recommend a small meal at first, but then you may proceed to your regular diet.  Drink plenty of fluids but you should avoid alcoholic beverages for 24 hours.  ACTIVITY:  You should plan to take it easy for the rest of today and you should NOT DRIVE or use heavy  machinery until tomorrow (because of the sedation medicines used during the test).    FOLLOW UP: Our staff will call the number listed on your records 48-72 hours following your procedure to check on you and address any questions or concerns that you may have regarding the information given to you following your procedure. If we do not reach you, we will leave a message.  We will attempt to reach you two times.  During this call, we will ask if you have developed any symptoms of COVID 19. If you develop any symptoms (ie: fever, flu-like symptoms, shortness of breath, cough etc.) before then, please call [phone].  If you test positive for Covid 19 in the 2 weeks post procedure, please call and report this information to us.    If any biopsies were taken you will be contacted by phone or by letter within the next 1-3 weeks.  Please call us at [phone] if you have not heard about the biopsies in 3 weeks.    SIGNATURES/CONFIDENTIALITY: You and/or your care partner have signed paperwork which will be entered into your electronic medical record.  These signatures attest to the fact that that the information above on your After Visit Summary has been reviewed and is understood.  Full responsibility of the confidentiality of this discharge information lies with you and/or your care-partner.  

## 2022-01-04 NOTE — Progress Notes (Signed)
Called to room to assist during endoscopic procedure.  Patient ID and intended procedure confirmed with present staff. Received instructions for my participation in the procedure from the performing physician.  

## 2022-01-04 NOTE — Progress Notes (Signed)
PT taken to PACU. Monitors in place. VSS. Report given to RN. 

## 2022-01-04 NOTE — Progress Notes (Signed)
Pt's states no medical or surgical changes since previsit or office visit. 

## 2022-01-04 NOTE — Progress Notes (Signed)
History & Physical  Primary Care Physician:  Lind Covert, MD Primary Gastroenterologist: Lucio Edward, MD  CHIEF COMPLAINT:  Personal history of colon polyps   HPI: Joy Yates is a 61 y.o. female with a personal history of sessile serrated polyps for surveillance colonoscopy.   Past Medical History:  Diagnosis Date   Blood transfusion without reported diagnosis    MANY YRS AGO AFTER LOSS OF A CHILD   Depression    SITUATIONAL AFTER THE DEATH OF HER MOTHER   GERD (gastroesophageal reflux disease)    Hyperlipidemia    Hypertension     Past Surgical History:  Procedure Laterality Date   APPENDECTOMY  05/2013   COLONOSCOPY     HEMORRHOID SURGERY     X 2   HYSTEROSCOPY WITH D & C N/A 01/07/2020   Procedure: DILATATION AND CURETTAGE /HYSTEROSCOPY;  Surgeon: Aletha Halim, MD;  Location: [facility];  Service: Gynecology;  Laterality: N/A;   OPERATIVE ULTRASOUND N/A 01/07/2020   Procedure: OPERATIVE ULTRASOUND;  Surgeon: Aletha Halim, MD;  Location: [facility];  Service: Gynecology;  Laterality: N/A;   POLYPECTOMY     TONSILLECTOMY     WISDOM TOOTH EXTRACTION  2000    Prior to Admission medications   Medication Sig Start Date End Date Taking? Authorizing Provider  atorvastatin (LIPITOR) 40 MG tablet TAKE ONE TABLET BY MOUTH DAILY 04/14/21 12/14/21  Lind Covert, MD  Cholecalciferol (VITAMIN D3) 2000 units TABS Take 1 tablet by mouth 2 (two) times daily.    [provider]  folic acid (FOLVITE) 1 MG tablet Take 1 mg by mouth daily.    [provider]  lisinopril (ZESTRIL) 2.5 MG tablet TAKE ONE TABLET BY MOUTH AT BEDTIME 12/22/21   Chambliss, Jeb Levering, MD  [facility] PO Take by mouth.    [provider]  Zoster Vaccine Adjuvanted [facility]) injection Inject 0.5 ml IM 03/22/21   Chambliss, Jeb Levering, MD    Current Outpatient Medications  Medication Sig Dispense Refill   atorvastatin  (LIPITOR) 40 MG tablet TAKE ONE TABLET BY MOUTH DAILY 90 tablet 3   Cholecalciferol (VITAMIN D3) 2000 units TABS Take 1 tablet by mouth 2 (two) times daily.     folic acid (FOLVITE) 1 MG tablet Take 1 mg by mouth daily.     lisinopril (ZESTRIL) 2.5 MG tablet TAKE ONE TABLET BY MOUTH AT BEDTIME 30 tablet 1   [facility] PO Take by mouth.     Zoster Vaccine Adjuvanted [facility]) injection Inject 0.5 ml IM 0.5 mL 0   Current Facility-Administered Medications  Medication Dose Route Frequency Provider Last Rate Last Admin   0.9 %  sodium chloride infusion  500 mL Intravenous Once Ladene Artist, MD        Allergies as of 01/04/2022   (No Known Allergies)    Family History  Problem Relation Age of Onset   Aneurysm Mother    Cerebral aneurysm Mother        NOVEMVER 2009   Hypertension Mother    Hypertension Sister    Hypertension Brother    Colon cancer Neg Hx    Pancreatic cancer Neg Hx    Stomach cancer Neg Hx    Rectal cancer Neg Hx    Esophageal cancer Neg Hx    Colon polyps Neg Hx     Social History   Socioeconomic History   Marital status: Married    Spouse name: Not  on file   Number of children: Not on file   Years of education: Not on file   Highest education level: Not on file  Occupational History   Occupation: account specialist  Tobacco Use   Smoking status: Never   Smokeless tobacco: Never  Vaping Use   Vaping Use: Never used  Substance and Sexual Activity   Alcohol use: Yes    Alcohol/week: 3.0 standard drinks    Types: 3 Standard drinks or equivalent per week    Comment: wine and some alcoholic drinks   Drug use: No   Sexual activity: Yes  Other Topics Concern   Not on file  Social History Narrative   Works at [employer] Strain: Not on file  Food Insecurity: Not on file  Transportation Needs: Not on file  Physical Activity: Not on file  Stress: Not on file  Social Connections: Not on file   Intimate Partner Violence: Not on file    Review of Systems:  All systems reviewed an negative except where noted in HPI.  Gen: Denies any fever, chills, sweats, anorexia, fatigue, weakness, malaise, weight loss, and sleep disorder CV: Denies chest pain, angina, palpitations, syncope, orthopnea, PND, peripheral edema, and claudication. Resp: Denies dyspnea at rest, dyspnea with exercise, cough, sputum, wheezing, coughing up blood, and pleurisy. GI: Denies vomiting blood, jaundice, and fecal incontinence.   Denies dysphagia or odynophagia. GU : Denies urinary burning, blood in urine, urinary frequency, urinary hesitancy, nocturnal urination, and urinary incontinence. MS: Denies joint pain, limitation of movement, and swelling, stiffness, low back pain, extremity pain. Denies muscle weakness, cramps, atrophy.  Derm: Denies rash, itching, dry skin, hives, moles, warts, or unhealing ulcers.  Psych: Denies depression, anxiety, memory loss, suicidal ideation, hallucinations, paranoia, and confusion. Heme: Denies bruising, bleeding, and enlarged lymph nodes. Neuro:  Denies any headaches, dizziness, paresthesias. Endo:  Denies any problems with DM, thyroid, adrenal function.   Physical Exam: General:  Alert, well-developed, in NAD Head:  Normocephalic and atraumatic. Eyes:  Sclera clear, no icterus.   Conjunctiva pink. Ears:  Normal auditory acuity. Mouth:  No deformity or lesions.  Neck:  Supple; no masses . Lungs:  Clear throughout to auscultation.   No wheezes, crackles, or rhonchi. No acute distress. Heart:  Regular rate and rhythm; no murmurs. Abdomen:  Soft, nondistended, nontender. No masses, hepatomegaly. No obvious masses.  Normal bowel .    Rectal:  Deferred   Msk:  Symmetrical without gross deformities.. Pulses:  Normal pulses noted. Extremities:  Without edema. Neurologic:  Alert and  oriented x4;  grossly normal neurologically. Skin:  Intact without significant lesions or  rashes. Cervical Nodes:  No significant cervical adenopathy. Psych:  Alert and cooperative. Normal mood and affect.   Impression / Plan:   Personal history of sessile serrated polyps for surveillance colonoscopy.  [name]. Fuller Plan  01/04/2022, 10:52 AM See Shea Evans, [facility], to contact our on call provider

## 2022-01-05 ENCOUNTER — Telehealth: Payer: Self-pay | Admitting: *Deleted

## 2022-01-05 ENCOUNTER — Telehealth: Payer: Self-pay

## 2022-01-05 NOTE — Telephone Encounter (Signed)
Attempted f/u phone call. No answer. No voicemail, unable to leave message.  

## 2022-01-05 NOTE — Telephone Encounter (Signed)
  Follow up Call-     01/04/2022   10:08 AM  Call back number  Post procedure Call Back phone  # [phone]  Permission to leave phone message No    Post op call attempted, no answer, unable to leave WM

## 2022-01-16 ENCOUNTER — Encounter: Payer: Self-pay | Admitting: Gastroenterology

## 2022-01-17 ENCOUNTER — Encounter: Payer: Self-pay | Admitting: *Deleted

## 2022-02-26 ENCOUNTER — Other Ambulatory Visit: Payer: Self-pay | Admitting: Family Medicine

## 2022-03-28 ENCOUNTER — Other Ambulatory Visit: Payer: Self-pay | Admitting: Family Medicine

## 2022-06-13 NOTE — Patient Instructions (Signed)
Good to see you today - Thank you for coming in ? ?Things we discussed today: ? ? ?You need a mammogram to prevent breast cancer.  Please schedule an appointment.  You can call [phone].    ? ?Please always bring your medication bottles ? ?Come back to see me in *** ?

## 2022-06-13 NOTE — Progress Notes (Unsigned)
    SUBJECTIVE:   CHIEF COMPLAINT / HPI:   Essential hypertension, benign Not at goal.  Stop lisinopril and start low dose losartan hctz as per her request.   Check labs    Hyperlipidemia On very infrequent statin.  Check labs today       PERTINENT  PMH / PSH: *** Patient Active Problem List   Diagnosis Date Noted   Polycythemia 03/23/2021   Hyperlipidemia 03/23/2021   Family history of brain aneurysm 03/23/2021   Cervical stenosis (uterine cervix) 12/16/2019   External hemorrhoids 12/17/2018   Essential hypertension, benign 07/20/2008    Current Outpatient Medications  Medication Instructions   atorvastatin (LIPITOR) 40 MG tablet TAKE ONE TABLET BY MOUTH DAILY   Cholecalciferol (VITAMIN D3) 2000 units TABS 1 tablet, Oral, 2 times daily   folic acid (FOLVITE) 1 mg, Oral, Daily   lisinopril (ZESTRIL) 2.5 mg, Oral, Daily at bedtime   ST JOHNS WORT PO Oral,     Zoster Vaccine Adjuvanted [facility]) injection Inject 0.5 ml IM       01/04/2022   11:43 AM 01/04/2022   11:33 AM 01/04/2022   11:23 AM  Vitals with BMI  Systolic [phone]  Diastolic 88 87 87  Pulse 66 70 73      OBJECTIVE:   There were no vitals taken for this visit.  ***  ASSESSMENT/PLAN:   There are no diagnoses linked to this encounter.   There are no Patient Instructions on file for this visit.   Lind Covert, MD [place]

## 2022-06-14 ENCOUNTER — Other Ambulatory Visit: Payer: Self-pay

## 2022-06-14 ENCOUNTER — Ambulatory Visit (INDEPENDENT_AMBULATORY_CARE_PROVIDER_SITE_OTHER): Payer: BC Managed Care – PPO | Admitting: Family Medicine

## 2022-06-14 ENCOUNTER — Encounter: Payer: Self-pay | Admitting: Family Medicine

## 2022-06-14 DIAGNOSIS — I10 Essential (primary) hypertension: Secondary | ICD-10-CM | POA: Diagnosis not present

## 2022-06-14 DIAGNOSIS — D751 Secondary polycythemia: Secondary | ICD-10-CM | POA: Diagnosis not present

## 2022-06-14 DIAGNOSIS — E785 Hyperlipidemia, unspecified: Secondary | ICD-10-CM | POA: Diagnosis not present

## 2022-06-14 MED ORDER — ATORVASTATIN CALCIUM 40 MG PO TABS: 40.0000 mg | ORAL_TABLET | Freq: Every day | ORAL | 3 refills | Status: DC

## 2022-06-14 MED ORDER — LISINOPRIL-HYDROCHLOROTHIAZIDE 10-12.5 MG PO TABS: 1.0000 | ORAL_TABLET | Freq: Every day | ORAL | 3 refills | Status: DC

## 2022-06-14 NOTE — Assessment & Plan Note (Signed)
May need to increase statin frequency - check labs today

## 2022-06-14 NOTE — Assessment & Plan Note (Signed)
Check labs today.

## 2022-06-14 NOTE — Assessment & Plan Note (Signed)
Not at goal.  Add hctz to lisinopril as a combination pill

## 2022-06-15 LAB — BASIC METABOLIC PANEL
BUN/Creatinine Ratio: 14 (ref 9–23)
BUN: 11 mg/dL (ref 6–24)
CO2: 26 mmol/L (ref 20–29)
Calcium: 9.9 mg/dL (ref 8.7–10.2)
Chloride: 101 mmol/L (ref 96–106)
Creatinine, Ser: 0.8 mg/dL (ref 0.57–1.00)
Glucose: 105 mg/dL — ABNORMAL HIGH (ref 70–99)
Potassium: 4.4 mmol/L (ref 3.5–5.2)
Sodium: 140 mmol/L (ref 134–144)
eGFR: 85 mL/min/{1.73_m2} (ref >59)

## 2022-06-15 LAB — LIPID PANEL
Chol/HDL Ratio: 4.8 ratio — ABNORMAL HIGH (ref 0.0–4.4)
Cholesterol, Total: 215 mg/dL — ABNORMAL HIGH (ref 100–199)
HDL: 45 mg/dL (ref >39)
LDL Chol Calc (NIH): 126 mg/dL — ABNORMAL HIGH (ref 0–99)
Triglycerides: 249 mg/dL — ABNORMAL HIGH (ref 0–149)
VLDL Cholesterol Cal: 44 mg/dL — ABNORMAL HIGH (ref 5–40)

## 2022-06-15 LAB — CBC
Hematocrit: 48.5 % — ABNORMAL HIGH (ref 34.0–46.6)
Hemoglobin: 16.6 g/dL — ABNORMAL HIGH (ref 11.1–15.9)
MCH: 30.2 pg (ref 26.6–33.0)
MCHC: 34.2 g/dL (ref 31.5–35.7)
MCV: 88 fL (ref 79–97)
Platelets: 280 10*3/uL (ref 150–450)
RBC: 5.49 x10E6/uL — ABNORMAL HIGH (ref 3.77–5.28)
RDW: 12.1 % (ref 11.7–15.4)
WBC: 6.9 10*3/uL (ref 3.4–10.8)

## 2022-06-18 ENCOUNTER — Other Ambulatory Visit: Payer: Self-pay

## 2022-06-18 ENCOUNTER — Emergency Department: Payer: BC Managed Care – PPO

## 2022-06-18 ENCOUNTER — Encounter: Payer: Self-pay | Admitting: Family Medicine

## 2022-06-18 DIAGNOSIS — J189 Pneumonia, unspecified organism: Secondary | ICD-10-CM | POA: Diagnosis not present

## 2022-06-18 DIAGNOSIS — R0789 Other chest pain: Secondary | ICD-10-CM | POA: Diagnosis not present

## 2022-06-18 DIAGNOSIS — J90 Pleural effusion, not elsewhere classified: Secondary | ICD-10-CM | POA: Diagnosis not present

## 2022-06-18 DIAGNOSIS — J9811 Atelectasis: Secondary | ICD-10-CM | POA: Diagnosis not present

## 2022-06-18 DIAGNOSIS — R079 Chest pain, unspecified: Secondary | ICD-10-CM | POA: Diagnosis not present

## 2022-06-18 DIAGNOSIS — Z20822 Contact with and (suspected) exposure to covid-19: Secondary | ICD-10-CM | POA: Insufficient documentation

## 2022-06-18 LAB — CBC
HCT: 44.1 % (ref 36.0–46.0)
Hemoglobin: 15.4 g/dL — ABNORMAL HIGH (ref 12.0–15.0)
MCH: 29.5 pg (ref 26.0–34.0)
MCHC: 34.9 g/dL (ref 30.0–36.0)
MCV: 84.5 fL (ref 80.0–100.0)
Platelets: 276 10*3/uL (ref 150–400)
RBC: 5.22 MIL/uL — ABNORMAL HIGH (ref 3.87–5.11)
RDW: 12.1 % (ref 11.5–15.5)
WBC: 11 10*3/uL — ABNORMAL HIGH (ref 4.0–10.5)
nRBC: 0 % (ref 0.0–0.2)

## 2022-06-18 LAB — BASIC METABOLIC PANEL
Anion gap: 9 (ref 5–15)
BUN: 15 mg/dL (ref 6–20)
CO2: 28 mmol/L (ref 22–32)
Calcium: 9.7 mg/dL (ref 8.9–10.3)
Chloride: 105 mmol/L (ref 98–111)
Creatinine, Ser: 0.84 mg/dL (ref 0.44–1.00)
GFR, Estimated: >60 mL/min (ref >60)
Glucose, Bld: 115 mg/dL — ABNORMAL HIGH (ref 70–99)
Potassium: 3.6 mmol/L (ref 3.5–5.1)
Sodium: 142 mmol/L (ref 135–145)

## 2022-06-18 LAB — TROPONIN I (HIGH SENSITIVITY): Troponin I (High Sensitivity): 3 ng/L (ref <18)

## 2022-06-18 NOTE — ED Triage Notes (Signed)
Pt arrives with c/o CP that started 3 days ago. Per pt, the pain worsens when she lays down. Pt endorses SOB and cough. Pt denies n/v.

## 2022-06-19 ENCOUNTER — Emergency Department: Payer: BC Managed Care – PPO

## 2022-06-19 ENCOUNTER — Emergency Department
Admission: EM | Admit: 2022-06-19 | Discharge: 2022-06-19 | Disposition: A | Discharge status: Home / Self Care | Payer: BC Managed Care – PPO | Attending: Emergency Medicine | Admitting: Emergency Medicine

## 2022-06-19 ENCOUNTER — Encounter: Payer: Self-pay | Admitting: Family Medicine

## 2022-06-19 DIAGNOSIS — R0781 Pleurodynia: Secondary | ICD-10-CM

## 2022-06-19 DIAGNOSIS — J90 Pleural effusion, not elsewhere classified: Secondary | ICD-10-CM | POA: Diagnosis not present

## 2022-06-19 DIAGNOSIS — J9811 Atelectasis: Secondary | ICD-10-CM | POA: Diagnosis not present

## 2022-06-19 DIAGNOSIS — J189 Pneumonia, unspecified organism: Secondary | ICD-10-CM

## 2022-06-19 LAB — RESP PANEL BY RT-PCR (FLU A&B, COVID) ARPGX2
Influenza A by PCR: NEGATIVE
Influenza B by PCR: NEGATIVE
SARS Coronavirus 2 by RT PCR: NEGATIVE

## 2022-06-19 LAB — TROPONIN I (HIGH SENSITIVITY): Troponin I (High Sensitivity): 4 ng/L (ref <18)

## 2022-06-19 MED ORDER — AZITHROMYCIN 250 MG PO TABS: ORAL_TABLET | ORAL | 0 refills | Status: DC

## 2022-06-19 MED ORDER — LACTATED RINGERS IV BOLUS
1000.0000 mL | Freq: Once | INTRAVENOUS | Status: AC
  Administered 2022-06-19: 1000 mL via INTRAVENOUS

## 2022-06-19 MED ORDER — AMOXICILLIN-POT CLAVULANATE 875-125 MG PO TABS: 1.0000 | ORAL_TABLET | Freq: Two times a day (BID) | ORAL | 0 refills | Status: AC

## 2022-06-19 MED ORDER — HYDROCODONE-ACETAMINOPHEN 5-325 MG PO TABS
2.0000 | ORAL_TABLET | Freq: Once | ORAL | Status: AC
  Administered 2022-06-19: 2 via ORAL
  Filled 2022-06-19: qty 2

## 2022-06-19 MED ORDER — ONDANSETRON 4 MG PO TBDP: ORAL_TABLET | ORAL | 0 refills | Status: DC

## 2022-06-19 MED ORDER — ONDANSETRON HCL 4 MG/2ML IJ SOLN
4.0000 mg | INTRAMUSCULAR | Status: AC
  Administered 2022-06-19: 4 mg via INTRAVENOUS
  Filled 2022-06-19: qty 2

## 2022-06-19 MED ORDER — KETOROLAC TROMETHAMINE 30 MG/ML IJ SOLN
15.0000 mg | Freq: Once | INTRAMUSCULAR | Status: AC
  Administered 2022-06-19: 15 mg via INTRAVENOUS
  Filled 2022-06-19: qty 1

## 2022-06-19 MED ORDER — IOHEXOL 350 MG/ML SOLN
75.0000 mL | Freq: Once | INTRAVENOUS | Status: AC | PRN
  Administered 2022-06-19: 75 mL via INTRAVENOUS

## 2022-06-19 MED ORDER — HYDROCODONE-ACETAMINOPHEN 5-325 MG PO TABS: 2.0000 | ORAL_TABLET | Freq: Four times a day (QID) | ORAL | 0 refills | Status: DC | PRN

## 2022-06-19 MED ORDER — AMOXICILLIN-POT CLAVULANATE 875-125 MG PO TABS
1.0000 | ORAL_TABLET | Freq: Once | ORAL | Status: AC
  Administered 2022-06-19: 1 via ORAL
  Filled 2022-06-19: qty 1

## 2022-06-19 MED ORDER — AZITHROMYCIN 500 MG PO TABS
500.0000 mg | ORAL_TABLET | ORAL | Status: AC
  Administered 2022-06-19: 500 mg via ORAL
  Filled 2022-06-19: qty 1

## 2022-06-19 MED ORDER — DOCUSATE SODIUM 100 MG PO CAPS: ORAL_CAPSULE | ORAL | 0 refills | Status: AC

## 2022-06-19 NOTE — ED Provider Notes (Signed)
[facility] Provider Note    Event Date/Time   First MD Initiated Contact with Patient 06/19/22 0159     (approximate)   History   Chest Pain   HPI  Joy Yates is a 61 y.o. female who presents for evaluation of about 5 days of Pain in the right side of her chest particular when she coughs. She has been feeling little bit short of breath and had some fever and chills intermittent for several days.  It seems like it is getting worse and by tonight the pain was very difficult for her to manage, particularly when she lies flat.  She is not very short of breath, just a little bit, but is more the pain that is bothersome to her.  Sitting up helps a little bit and lying back makes it worse.  She has not passed out.  She has no sore throat or nasal congestion.  No abdominal pain, nausea, vomiting, nor dysuria.  No history of heart disease.  No history of blood clots in her legs or her lungs.  She works from home and has not been traveling recently.  No unilateral leg pain or swelling.     Physical Exam   Triage Vital Signs: ED Triage Vitals  Enc Vitals Group     BP 06/18/22 2130 (!) 180/106     Pulse Rate 06/18/22 2130 (!) 101     Resp 06/18/22 2130 18     Temp 06/18/22 2130 98.2 F (36.8 C)     Temp Source 06/18/22 2348 Oral     SpO2 06/18/22 2130 93 %     Weight 06/18/22 2127 84.4 kg (186 lb)     Height 06/18/22 2127 1.676 m ('5\' 6"'$ )     Head Circumference --      Peak Flow --      Pain Score 06/18/22 2127 10     Pain Loc --      Pain Edu? --      Excl. in [place]? --     Most recent vital signs: Vitals:   06/19/22 0330 06/19/22 0400  BP: 135/82 (!) 140/85  Pulse: 86 80  Resp: 20 (!) 21  Temp:    SpO2: 95% 92%     General: Awake, no distress.  Awake and alert, conversant, pleasant. CV:  Good peripheral perfusion.  Normal heart sounds.  Regular rate and rhythm. Resp:  Normal effort.  Lungs are clear to auscultation.  Very mild tachypnea and  shallow breathing but no respiratory distress. Abd:  No distention.  No tenderness to palpation of the abdomen. Other:  No focal neurological deficits.   ED Results / Procedures / Treatments   Labs (all labs ordered are listed, but only abnormal results are displayed) Labs Reviewed  BASIC METABOLIC PANEL - Abnormal; Notable for the following components:      Result Value   Glucose, Bld 115 (*)    All other components within normal limits  CBC - Abnormal; Notable for the following components:   WBC 11.0 (*)    RBC 5.22 (*)    Hemoglobin 15.4 (*)    All other components within normal limits  RESP PANEL BY RT-PCR (FLU A&B, COVID) ARPGX2  TROPONIN I (HIGH SENSITIVITY)  TROPONIN I (HIGH SENSITIVITY)     EKG  ED ECG REPORT I, Hinda Kehr, the attending physician, personally viewed and interpreted this ECG.  Date: 06/18/2022 EKG Time: 21: 28 Rate: 111 Rhythm: Sinus tachycardia QRS  Axis: normal Intervals: normal ST/T Wave abnormalities: Non-specific ST segment / T-wave changes, but no clear evidence of acute ischemia. Narrative Interpretation: no definitive evidence of acute ischemia; does not meet STEMI criteria.    RADIOLOGY I viewed and interpreted the patient's two-view chest x-ray.  There is some streakiness that could be atelectasis or could be pneumonia.  The radiologist reported atelectasis versus infiltrate.  I also viewed and interpreted the patient's CTA chest.  I see no evidence of pulmonary embolism.  There is also some streakiness that again could be infiltrate or atelectasis.  I consulted by phone with Dr. Sherrye Payor with radiology and we discussed the CT scan.  He agrees that the streakiness seen in multiple lobes of the lungs could definitely represent pneumonia.  He also mention the relatively small but still obvious right-sided pleural effusion, further suggestive of an infectious process.    PROCEDURES:  Critical Care performed:  No  Procedures   MEDICATIONS ORDERED IN ED: Medications  lactated ringers bolus 1,000 mL (0 mLs Intravenous Stopped 06/19/22 0406)  ketorolac (TORADOL) 30 MG/ML injection 15 mg (15 mg Intravenous Given 06/19/22 0234)  iohexol (OMNIPAQUE) 350 MG/ML injection 75 mL (75 mLs Intravenous Contrast Given 06/19/22 0256)  ondansetron (ZOFRAN) injection 4 mg (4 mg Intravenous Given 06/19/22 0257)  HYDROcodone-acetaminophen (NORCO/VICODIN) 5-325 MG per tablet 2 tablet (2 tablets Oral Given 06/19/22 0406)  amoxicillin-clavulanate (AUGMENTIN) 875-125 MG per tablet 1 tablet (1 tablet Oral Given 06/19/22 0406)  azithromycin (ZITHROMAX) tablet 500 mg (500 mg Oral Given 06/19/22 0406)     IMPRESSION / MDM / ASSESSMENT AND PLAN / ED COURSE  I reviewed the triage vital signs and the nursing notes.                              Differential diagnosis includes, but is not limited to, pulmonary embolism, pneumonia, viral infection, pneumothorax, costochondritis, ACS, AAS.  Patient's presentation is most consistent with acute presentation with potential threat to life or bodily function.  Labs/studies ordered: EKG, two-view chest x-ray, CBC, basic metabolic panel, respiratory viral panel, high-sensitivity troponin x2.  Patient's vital signs are notable for a little bit of tachypnea and an SPO2 in the mid to lower 90s.  She also has a very mildly elevated temperature consistent with a mild fever.  Blood pressure and heart rate are essentially normal.  Atelectasis versus pneumonia on chest x-ray.  Given the pleuritic nature of her chest pain that seems quite intense, I want to rule out pulmonary embolism; although she has no clear risk factors, the degree of discomfort she is experiencing could easily be explained by PE, and the morbidity/mortality of a misdiagnosis could be severe.  I explained this to the patient and she wholeheartedly agreed with the plan to proceed with imaging.  As documented above, the CTA  chest shows no evidence of PE, but does have multiple areas that could represent pneumonia, as confirmed by Dr. Sherrye Payor with radiology.  She has a very mild leukocytosis, otherwise normal lab work.  Negative troponins and negative respiratory viral panel.  Under the circumstances, given the constellation of symptoms, I feel that it is appropriate to treat her empirically for community-acquired pneumonia.  I considered hospitalization, but given that she is not requiring supplemental oxygen and is not in severe distress, with minimal comorbidities, I think it is very reasonable to treat her as an outpatient with strict return precautions.  She agrees with this plan.  Medications ordered: Norco x2, Toradol 15 mg IV, Zofran 4 mg IV, Augmentin x1, azithromycin 500 mg by mouth.  I wrote prescriptions for Augmentin and azithromycin as well as for Zofran and Norco (for pain and cough suppressant).  I gave both verbal and written return precautions and she and her husband agree with the plan.  The patient was on the cardiac monitor to evaluate for evidence of arrhythmia and/or significant heart rate changes.      FINAL CLINICAL IMPRESSION(S) / ED DIAGNOSES   Final diagnoses:  Community acquired pneumonia, unspecified laterality  Pleural effusion on right  Pleuritic chest pain     Rx / DC Orders   ED Discharge Orders          Ordered    amoxicillin-clavulanate (AUGMENTIN) 875-125 MG tablet  2 times daily        06/19/22 0406    azithromycin (ZITHROMAX) 250 MG tablet        06/19/22 0406    HYDROcodone-acetaminophen (NORCO/VICODIN) 5-325 MG tablet  Every 6 hours PRN        06/19/22 0406    ondansetron (ZOFRAN-ODT) 4 MG disintegrating tablet        06/19/22 0406    docusate sodium (COLACE) 100 MG capsule        06/19/22 0406             Note:  This document was prepared using Dragon voice recognition software and may include unintentional dictation errors.   Hinda Kehr,  MD 06/19/22 [phone]

## 2022-06-19 NOTE — Discharge Instructions (Addendum)
As we discussed, your combination of symptoms are, we believe, caused by pneumonia.  However your work-up was generally reassuring.  You have multiple areas of your lungs that appear to have a mild infection, and you have a small pleural effusion on the right, all of which are consistent with a mild case of pneumonia.  But your vital signs are reassuring, your lab work is otherwise reassuring, and you have no evidence of blood clot in your lungs on your CT scan.  You should be appropriate for outpatient management.  Please take the full course of antibiotics as prescribed.  You can take another dose of Augmentin in the morning but please wait until the evening to take another dose of azithromycin (the Augmentin is taken every 12 hours and the azithromycin once a day).    Use the prescribed Norco as needed to help as a cough suppressant as well as as pain medication.  Please be cautious as it can make you sleepy and you should not take it if you are going to be driving or operating machinery.  We also recommend that you take ibuprofen 600 mg 3 times a day with meals at least over the next 5 days to help with pain and inflammation.  Follow-up with your regular doctor at the next available opportunity.  Return to the emergency department if you develop new or worsening symptoms that concern you.

## 2022-06-21 ENCOUNTER — Ambulatory Visit: Payer: BC Managed Care – PPO | Admitting: Family Medicine

## 2022-06-27 ENCOUNTER — Other Ambulatory Visit: Payer: Self-pay | Admitting: Family Medicine

## 2022-09-14 ENCOUNTER — Encounter: Payer: Self-pay | Admitting: Family Medicine

## 2022-09-23 ENCOUNTER — Encounter: Payer: Self-pay | Admitting: Family Medicine

## 2022-09-24 ENCOUNTER — Encounter: Payer: Self-pay | Admitting: Family Medicine

## 2022-09-27 ENCOUNTER — Ambulatory Visit: Payer: BC Managed Care – PPO | Admitting: Family Medicine

## 2022-09-28 ENCOUNTER — Encounter: Payer: Self-pay | Admitting: Family Medicine

## 2022-09-28 DIAGNOSIS — I10 Essential (primary) hypertension: Secondary | ICD-10-CM

## 2022-09-28 MED ORDER — LOSARTAN POTASSIUM 50 MG PO TABS: 50.0000 mg | ORAL_TABLET | Freq: Every day | ORAL | 0 refills | Status: DC

## 2022-10-07 ENCOUNTER — Encounter: Payer: Self-pay | Admitting: Family Medicine

## 2022-10-08 ENCOUNTER — Encounter: Payer: Self-pay | Admitting: Family Medicine

## 2022-10-09 ENCOUNTER — Other Ambulatory Visit: Payer: Self-pay | Admitting: Family Medicine

## 2022-10-09 DIAGNOSIS — Z1231 Encounter for screening mammogram for malignant neoplasm of breast: Secondary | ICD-10-CM

## 2022-10-12 ENCOUNTER — Encounter: Payer: Self-pay | Admitting: Family Medicine

## 2022-10-15 ENCOUNTER — Encounter: Payer: Self-pay | Admitting: Family Medicine

## 2022-10-24 NOTE — Progress Notes (Unsigned)
    SUBJECTIVE:   CHIEF COMPLAINT / HPI:   Annual Wellness or [phone]  Mother, who did experience a Brain Aneurysm, at the age of 63, and sadly she died from it    Essential hypertension, benign Checks sometimes at home often in 140s/90s.  Taking lisinopril 2.5 twice a day most days  No chest pain or shortness of breath Does try to walk regularly   Hyperlipidemia Takes her statin 2 x per week     PERTINENT  PMH / PSH: *** Patient Active Problem List   Diagnosis Date Noted   Polycythemia 03/23/2021   Hyperlipidemia 03/23/2021   Family history of brain aneurysm 03/23/2021   Cervical stenosis (uterine cervix) 12/16/2019   External hemorrhoids 12/17/2018   Essential hypertension, benign 07/20/2008    Current Outpatient Medications  Medication Instructions   atorvastatin (LIPITOR) 40 mg, Oral, Daily   Cholecalciferol (VITAMIN D3) 2000 units TABS 1 tablet, Oral, 2 times daily   docusate sodium (COLACE) 100 MG capsule Take 1 tablet once or twice daily as needed for constipation while taking narcotic pain medicine   folic acid (FOLVITE) 1 mg, Oral, Daily   HYDROcodone-acetaminophen (NORCO/VICODIN) 5-325 MG tablet 2 tablets, Oral, Every 6 hours PRN   losartan (COZAAR) 50 mg, Oral, Daily   ondansetron (ZOFRAN-ODT) 4 MG disintegrating tablet Allow 1-2 tablets to dissolve in your mouth every 8 hours as needed for nausea/vomiting   ST JOHNS WORT PO Oral,     Zoster Vaccine Adjuvanted [facility]) injection Inject 0.5 ml IM       06/19/2022    4:00 AM 06/19/2022    3:30 AM 06/19/2022    3:00 AM  Vitals with BMI  Systolic [phone]  Diastolic 85 82 84  Pulse 80 86 98      OBJECTIVE:   There were no vitals taken for this visit.  ***  ASSESSMENT/PLAN:   There are no diagnoses linked to this encounter.   There are no Patient Instructions on file for this visit.   Lind Covert, MD [place]

## 2022-10-25 ENCOUNTER — Encounter: Payer: Self-pay | Admitting: Family Medicine

## 2022-10-25 ENCOUNTER — Ambulatory Visit (INDEPENDENT_AMBULATORY_CARE_PROVIDER_SITE_OTHER): Payer: BC Managed Care – PPO | Admitting: Family Medicine

## 2022-10-25 VITALS — BP 135/89 | HR 95 | Ht 66.0 in | Wt 182.0 lb

## 2022-10-25 DIAGNOSIS — E785 Hyperlipidemia, unspecified: Secondary | ICD-10-CM

## 2022-10-25 DIAGNOSIS — I10 Essential (primary) hypertension: Secondary | ICD-10-CM | POA: Diagnosis not present

## 2022-10-25 DIAGNOSIS — Z8249 Family history of ischemic heart disease and other diseases of the circulatory system: Secondary | ICD-10-CM | POA: Diagnosis not present

## 2022-10-25 NOTE — Assessment & Plan Note (Signed)
Will check lipids. She is interested in bempedoic acid.  Suggested will see how her lipid control is before changing.

## 2022-10-25 NOTE — Assessment & Plan Note (Signed)
Will refer to neurology per patients wishes.  Discussed low likelihood of needing an intervention.  Will focus on risk factor reduction

## 2022-10-25 NOTE — Patient Instructions (Signed)
Good to see you today - Thank you for coming in  Things we discussed today:  I have put in a referral to neurology.  They should call you to schedule an appointment.  This could appear as an unknown number on your phone.  If you have not heard from them in 2 weeks please let me know.   I will call you if your tests are not good.  Otherwise, I will send you a message on MyChart (if it is active) or a letter in the mail..  If you do not hear from me with in 2 weeks please call our office.     Your goal blood pressure is less than 140/90.  Check your blood pressure several times a week.  If regularly higher than this please let me know - either with MyChart or leaving a phone message. Next visit please bring in your blood pressure cuff.     I will ask our nurse to contact you for AWV  Please always bring your medication bottles  Come back to see me in 3 months

## 2022-10-25 NOTE — Assessment & Plan Note (Signed)
Near goal.  Monitor at home.  Check labs

## 2022-10-26 ENCOUNTER — Encounter: Payer: Self-pay | Admitting: Family Medicine

## 2022-10-26 LAB — BASIC METABOLIC PANEL
BUN/Creatinine Ratio: 20 (ref 12–28)
BUN: 14 mg/dL (ref 8–27)
CO2: 26 mmol/L (ref 20–29)
Calcium: 9.4 mg/dL (ref 8.7–10.3)
Chloride: 104 mmol/L (ref 96–106)
Creatinine, Ser: 0.7 mg/dL (ref 0.57–1.00)
Glucose: 101 mg/dL — ABNORMAL HIGH (ref 70–99)
Potassium: 4.4 mmol/L (ref 3.5–5.2)
Sodium: 144 mmol/L (ref 134–144)
eGFR: 99 mL/min/{1.73_m2} (ref >59)

## 2022-10-26 LAB — LIPID PANEL
Chol/HDL Ratio: 4.3 ratio (ref 0.0–4.4)
Cholesterol, Total: 179 mg/dL (ref 100–199)
HDL: 42 mg/dL (ref >39)
LDL Chol Calc (NIH): 111 mg/dL — ABNORMAL HIGH (ref 0–99)
Triglycerides: 144 mg/dL (ref 0–149)
VLDL Cholesterol Cal: 26 mg/dL (ref 5–40)

## 2022-10-31 ENCOUNTER — Encounter: Payer: Self-pay | Admitting: Family Medicine

## 2022-11-23 ENCOUNTER — Ambulatory Visit
Admission: RE | Admit: 2022-11-23 | Discharge: 2022-11-23 | Disposition: A | Discharge status: Home / Self Care | Payer: BC Managed Care – PPO | Source: Ambulatory Visit | Attending: Family Medicine | Admitting: Family Medicine

## 2022-11-23 ENCOUNTER — Ambulatory Visit: Payer: Self-pay | Admitting: Family Medicine

## 2022-11-23 DIAGNOSIS — Z1231 Encounter for screening mammogram for malignant neoplasm of breast: Secondary | ICD-10-CM | POA: Diagnosis not present

## 2022-11-24 ENCOUNTER — Encounter: Payer: Self-pay | Admitting: Family Medicine

## 2022-12-27 ENCOUNTER — Other Ambulatory Visit: Payer: Self-pay | Admitting: Family Medicine

## 2022-12-27 DIAGNOSIS — I10 Essential (primary) hypertension: Secondary | ICD-10-CM

## 2022-12-28 ENCOUNTER — Ambulatory Visit: Payer: BC Managed Care – PPO | Admitting: Neurology

## 2023-01-17 ENCOUNTER — Ambulatory Visit: Payer: BC Managed Care – PPO | Admitting: Family Medicine

## 2023-03-27 ENCOUNTER — Other Ambulatory Visit: Payer: Self-pay | Admitting: Family Medicine

## 2023-03-27 DIAGNOSIS — I10 Essential (primary) hypertension: Secondary | ICD-10-CM

## 2023-06-23 ENCOUNTER — Other Ambulatory Visit: Payer: Self-pay | Admitting: Family Medicine

## 2023-06-23 DIAGNOSIS — E785 Hyperlipidemia, unspecified: Secondary | ICD-10-CM

## 2023-08-25 ENCOUNTER — Encounter: Payer: Self-pay | Admitting: Family Medicine

## 2023-08-28 ENCOUNTER — Encounter: Payer: Self-pay | Admitting: Family Medicine

## 2023-08-29 ENCOUNTER — Telehealth: Payer: Self-pay | Admitting: Family Medicine

## 2023-08-29 NOTE — Telephone Encounter (Signed)
 Patient called and stated she is wanting to stay with Dr. Bevin Bucks.   Thanks!

## 2023-08-29 NOTE — Telephone Encounter (Addendum)
    08/29/23  8:57 AM Unsigned Note I called and spoke with Andres Bangs and his wife. Both of them likes Dr. Zheng and wished to switch to him as PCP. I did advise them that Dr. Jari Merles is a resident and will graduate in about a year after which they would be switched to a new resident PCP. They will not be able to switch back to a faculty. They both agreed with the switch.         08/29/23  8:54 AM Note ----- Message from Albin Huh sent at 08/29/2023  8:41 AM EST ----- Regarding: PCP Switch Hi Dr. Grandville Lax,   I saw this pt yesterday and they sent a MyChart message requesting me to be their PCP. I am ok with this as long as you are too, but wanted to check with you first.

## 2023-10-05 ENCOUNTER — Encounter: Payer: BC Managed Care – PPO | Admitting: Family Medicine

## 2023-11-14 NOTE — Progress Notes (Unsigned)
    SUBJECTIVE:   Chief compliant/HPI: annual examination  Joy Yates is a 63 y.o. who presents today for an annual exam.   The patient reports 2 weeks of cough congestion and phlegm production.  She had fevers at the start this.  Her husband is also sick.  He works at the [employer].  She denies dyspnea or chest pain.  She does report that she think she is wheezing intermittently.  The patient reports she has muscle aches from her statin.  She is interested in trying alternative.  The patient does excellent with lifestyle changes.  She regularly works out.  She tries to engage in healthy lifestyle.  She does not smoke she does not drink alcohol.  Family history is updated she does have a family history of aneurysm and heart attacks (dad- 92s).   Her mom had a brain aneurysm in her 35s.   History tabs reviewed and updated   Review of systems form reviewed and notable for none except for above.   OBJECTIVE:   BP (!) 138/92   Pulse 77   Temp 98.1 F (36.7 C) (Oral)   Ht 5\' 5"  (1.651 m)   Wt 190 lb 9.6 oz (86.5 kg)   SpO2 99%   BMI 31.72 kg/m    HEENT: EOMI. Sclera without injection or icterus. MMM. External auditory canal examined and WNL. TM normal appearance, no erythema or bulging. Neck: Supple. No LAD  Cardiac: Regular rate and rhythm. Normal S1/S2. No murmurs, rubs, or gallops appreciated. Lungs: Clear bilaterally to ascultation.  Abdomen: Normoactive bowel sounds. No tenderness to deep or light palpation. No rebound or guarding.   Neuro: Pleasant appropriate Psych: Pleasant and appropriate    ASSESSMENT/PLAN:   Assessment & Plan Annual physical exam See below Hyperlipidemia, unspecified hyperlipidemia type DC atorvastatin Lipid panel today Start rosuvastatin 5 mg daily  Essential hypertension, benign Continue losartan 50 mg daily Not at goal on our readings  Has home cuff and will send values Start combination pill if not at goal  Acute bacterial  sinusitis No signs of pneumonia on exam Rx amoxicillin  Family history of brain aneurysm Referral to Neurology    Annual Examination  See AVS for age appropriate recommendations   BP reviewed and not at goal.  Discussed lifestyle modifications as well as monitoring at home.  She was sending readings.  Weight continues to be elevated will start a combination pill.  May benefit from olmesartan amlodipine. Asked about intimate partner violence and resources given as appropriate   Considered the following items based upon USPSTF recommendations: Diabetes screening: ordered Screening for elevated cholesterol: ordered  Osteoporosis screening considered based upon risk of fracture from [facility] calculator. Major osteoporotic fracture risk is 6.7%. DEXA not ordered.  Reviewed risk factors for latent tuberculosis and not indicated   Breast cancer screening:  up to date  Colorectal cancer screening: up to date on screening for CRC.  BP goal discussed-she will message with values and follow up    Westley Chandler, MD [facility]

## 2023-11-15 ENCOUNTER — Ambulatory Visit: Payer: BC Managed Care – PPO | Admitting: Family Medicine

## 2023-11-15 ENCOUNTER — Encounter: Payer: Self-pay | Admitting: Family Medicine

## 2023-11-15 VITALS — BP 138/92 | HR 77 | Temp 98.1°F | Ht 65.0 in | Wt 190.6 lb

## 2023-11-15 DIAGNOSIS — I10 Essential (primary) hypertension: Secondary | ICD-10-CM | POA: Diagnosis not present

## 2023-11-15 DIAGNOSIS — E785 Hyperlipidemia, unspecified: Secondary | ICD-10-CM

## 2023-11-15 DIAGNOSIS — Z8249 Family history of ischemic heart disease and other diseases of the circulatory system: Secondary | ICD-10-CM

## 2023-11-15 DIAGNOSIS — J019 Acute sinusitis, unspecified: Secondary | ICD-10-CM

## 2023-11-15 DIAGNOSIS — R0683 Snoring: Secondary | ICD-10-CM

## 2023-11-15 DIAGNOSIS — Z0000 Encounter for general adult medical examination without abnormal findings: Secondary | ICD-10-CM

## 2023-11-15 DIAGNOSIS — B9689 Other specified bacterial agents as the cause of diseases classified elsewhere: Secondary | ICD-10-CM

## 2023-11-15 LAB — POCT GLYCOSYLATED HEMOGLOBIN (HGB A1C): Hemoglobin A1C: 5.4 % (ref 4.0–5.6)

## 2023-11-15 MED ORDER — ROSUVASTATIN CALCIUM 5 MG PO TABS: 5.0000 mg | ORAL_TABLET | Freq: Every day | ORAL | 3 refills | Status: AC

## 2023-11-15 MED ORDER — SHINGRIX 50 MCG/0.5ML IM SUSR: 0.5000 mL | INTRAMUSCULAR | 0 refills | Status: DC

## 2023-11-15 MED ORDER — AMOXICILLIN 875 MG PO TABS: 875.0000 mg | ORAL_TABLET | Freq: Two times a day (BID) | ORAL | 0 refills | Status: AC

## 2023-11-15 NOTE — Assessment & Plan Note (Signed)
 Continue losartan 50 mg daily Not at goal on our readings  Has home cuff and will send values Start combination pill if not at goal

## 2023-11-15 NOTE — Assessment & Plan Note (Signed)
 DC atorvastatin Lipid panel today Start rosuvastatin 5 mg daily

## 2023-11-15 NOTE — Assessment & Plan Note (Signed)
 Referral to Neurology

## 2023-11-15 NOTE — Patient Instructions (Addendum)
 It was wonderful to see you today.  Please bring ALL of your medications with you to every visit.   Today we talked about:  - We will try Crestor a much lower dose statin for your cholesterol  - Please send me 2-3 home readings of your blood pressure  - Your blood pressure goal is <130/80  - We will get blood work today  - I will message you with results  - I sent in an antibiotic for your infection   I have referred you to Neurology  to further evaluate your concern. If you do not received a phone call about this appointment within 3-4 weeks, please call our office back at [phone]. Clemencia Course coordinates our referrals and can assist you in this.    Please follow up in 3 months   Thank you for choosing [facility].   Please call [phone] with any questions about today's appointment.  Please be sure to schedule follow up at the front  desk before you leave today.   Terisa Starr, MD  Family Medicine

## 2023-11-16 LAB — CBC
Hematocrit: 50.8 % — ABNORMAL HIGH (ref 34.0–46.6)
Hemoglobin: 17.2 g/dL — ABNORMAL HIGH (ref 11.1–15.9)
MCH: 30 pg (ref 26.6–33.0)
MCHC: 33.9 g/dL (ref 31.5–35.7)
MCV: 89 fL (ref 79–97)
Platelets: 313 10*3/uL (ref 150–450)
RBC: 5.73 x10E6/uL — ABNORMAL HIGH (ref 3.77–5.28)
RDW: 12.4 % (ref 11.7–15.4)
WBC: 6.3 10*3/uL (ref 3.4–10.8)

## 2023-11-16 LAB — LIPID PANEL
Chol/HDL Ratio: 5.4 ratio — ABNORMAL HIGH (ref 0.0–4.4)
Cholesterol, Total: 237 mg/dL — ABNORMAL HIGH (ref 100–199)
HDL: 44 mg/dL (ref >39)
LDL Chol Calc (NIH): 153 mg/dL — ABNORMAL HIGH (ref 0–99)
Triglycerides: 220 mg/dL — ABNORMAL HIGH (ref 0–149)
VLDL Cholesterol Cal: 40 mg/dL (ref 5–40)

## 2023-11-16 LAB — BASIC METABOLIC PANEL WITH GFR
BUN/Creatinine Ratio: 15 (ref 12–28)
BUN: 12 mg/dL (ref 8–27)
CO2: 25 mmol/L (ref 20–29)
Calcium: 9.8 mg/dL (ref 8.7–10.3)
Chloride: 101 mmol/L (ref 96–106)
Creatinine, Ser: 0.8 mg/dL (ref 0.57–1.00)
Glucose: 113 mg/dL — ABNORMAL HIGH (ref 70–99)
Potassium: 4.3 mmol/L (ref 3.5–5.2)
Sodium: 144 mmol/L (ref 134–144)
eGFR: 84 mL/min/{1.73_m2} (ref >59)

## 2023-11-25 ENCOUNTER — Other Ambulatory Visit: Payer: Self-pay | Admitting: Family Medicine

## 2023-11-25 DIAGNOSIS — I10 Essential (primary) hypertension: Secondary | ICD-10-CM

## 2023-12-12 ENCOUNTER — Encounter: Payer: Self-pay | Admitting: Family Medicine

## 2023-12-13 MED ORDER — ONDANSETRON 4 MG PO TBDP: ORAL_TABLET | ORAL | 0 refills | Status: AC

## 2023-12-21 ENCOUNTER — Encounter: Payer: Self-pay | Admitting: Family Medicine

## 2023-12-21 ENCOUNTER — Ambulatory Visit (INDEPENDENT_AMBULATORY_CARE_PROVIDER_SITE_OTHER): Admitting: Family Medicine

## 2023-12-21 VITALS — BP 119/89 | HR 70 | Ht 65.0 in | Wt 190.8 lb

## 2023-12-21 DIAGNOSIS — K219 Gastro-esophageal reflux disease without esophagitis: Secondary | ICD-10-CM

## 2023-12-21 DIAGNOSIS — E785 Hyperlipidemia, unspecified: Secondary | ICD-10-CM | POA: Diagnosis not present

## 2023-12-21 DIAGNOSIS — F1090 Alcohol use, unspecified, uncomplicated: Secondary | ICD-10-CM

## 2023-12-21 DIAGNOSIS — I10 Essential (primary) hypertension: Secondary | ICD-10-CM

## 2023-12-21 DIAGNOSIS — R062 Wheezing: Secondary | ICD-10-CM

## 2023-12-21 MED ORDER — LISINOPRIL 20 MG PO TABS: 20.0000 mg | ORAL_TABLET | Freq: Every day | ORAL | 3 refills | Status: DC

## 2023-12-21 MED ORDER — OMEPRAZOLE 40 MG PO CPDR
40.0000 mg | DELAYED_RELEASE_CAPSULE | Freq: Every day | ORAL | 0 refills | Status: DC
Start: 2023-12-21 — End: 2024-02-11

## 2023-12-21 NOTE — Patient Instructions (Addendum)
 Good to see you today - Thank you for coming in  Things we discussed today: - Stop taking your blood pressure medicine completely for 3 days and then you can start your new lisinopril  prescription and no longer take losartan  - You will need to come back in 2 weeks for lab work since you are starting a new blood pressure medicine - I have referred you to an allergist and a gastroenterologist, they will call you to schedule an appointment - Please take omeprazole for 8 full weeks, I have sent in a prescription -I have prescribed you an EpiPen as well.  If you ever feel like you are having an allergic reaction to where your throat is closing up, you cannot breathe please use it and go to the emergency room - We are checking some lab work today.  I will message you on MyChart with results, unless they are concerning and I will call you  Please follow-up with your PCP in 3 to 6 months

## 2023-12-21 NOTE — Progress Notes (Signed)
    SUBJECTIVE:   CHIEF COMPLAINT / HPI:   Wheezing x months Worse at night, worse after eating certain foods She has eaten salads and then developed vomiting and felt like her wheezing was worse Hx pneumonia one year ago No hx asthma or other lung disease Thinks wheezing could be r/t GERD, hx GERD for many years on intermittent 2 week long prilosec treatments Would like referral to allergist  Drinks a vodka martini each day, would like liver checked Would like to switch losartan  to lisinopril , states she took her husband's lisinopril  last night and it helped her BP more than losartan   Dad - asthma  No other fhx of lung disease  No SOB, fever, cough  PERTINENT  PMH / PSH: HTN, HLD, polycythemia  OBJECTIVE:   Ht 5\' 5"  (1.651 m)   Wt 190 lb 12.8 oz (86.5 kg)   BMI 31.75 kg/m   General: well appearing, NAD Cardiovascular: RRR, no m/r/g Respiratory: normal work of breathing on RA, CTAB, no wheezing on exam Abd: bowel sounds present, soft  ASSESSMENT/PLAN:   Assessment & Plan Wheezing Ddx include gerd vs allergic response vs asthma. Reassuringly afebrile, no cough, no nighttime awakenings (less suspicious of asthma). No wheezing on exam today. Given sx worsen at night and after certain foods suspect it could be related to GERD - trial omeprazole 40mg  daily x8 weeks - referral to allergist for food allergy testing - could consider PFTs if sx do not improve Gastroesophageal reflux disease, unspecified whether esophagitis present Longstanding for over 10 years, has never seen GI, could probably benefit from EGD - omeprazole as above - GI referral sent Hyperlipidemia, unspecified hyperlipidemia type Stopped statin because she felt it made her wheezing worse Last lipid panel 11/2023 would like it checked again today Hypertension, unspecified type Stable, will switch losartan  to lisinopril  at pt request Advised to stop all BP meds for 3 days before starting lisinopril  BMP  ordered for future, advised to return in 2 weeks for lab visit Alcohol use CMP and hepatic panel today at pt request as she has one drink per day   Sarahann Cumins, DO [facility]

## 2023-12-21 NOTE — Assessment & Plan Note (Signed)
 Stopped statin because she felt it made her wheezing worse Last lipid panel 11/2023 would like it checked again today

## 2023-12-22 LAB — LIPID PANEL
Chol/HDL Ratio: 5.1 ratio — ABNORMAL HIGH (ref 0.0–4.4)
Cholesterol, Total: 226 mg/dL — ABNORMAL HIGH (ref 100–199)
HDL: 44 mg/dL (ref >39)
LDL Chol Calc (NIH): 143 mg/dL — ABNORMAL HIGH (ref 0–99)
Triglycerides: 214 mg/dL — ABNORMAL HIGH (ref 0–149)
VLDL Cholesterol Cal: 39 mg/dL (ref 5–40)

## 2023-12-22 LAB — COMPREHENSIVE METABOLIC PANEL WITH GFR
ALT: 42 IU/L — ABNORMAL HIGH (ref 0–32)
AST: 31 IU/L (ref 0–40)
Albumin: 4.5 g/dL (ref 3.9–4.9)
Alkaline Phosphatase: 79 IU/L (ref 44–121)
BUN/Creatinine Ratio: 15 (ref 12–28)
BUN: 12 mg/dL (ref 8–27)
Bilirubin Total: 0.7 mg/dL (ref 0.0–1.2)
CO2: 25 mmol/L (ref 20–29)
Calcium: 9.8 mg/dL (ref 8.7–10.3)
Chloride: 102 mmol/L (ref 96–106)
Creatinine, Ser: 0.8 mg/dL (ref 0.57–1.00)
Globulin, Total: 1.8 g/dL (ref 1.5–4.5)
Glucose: 115 mg/dL — ABNORMAL HIGH (ref 70–99)
Potassium: 4.3 mmol/L (ref 3.5–5.2)
Sodium: 142 mmol/L (ref 134–144)
Total Protein: 6.3 g/dL (ref 6.0–8.5)
eGFR: 84 mL/min/{1.73_m2} (ref >59)

## 2023-12-22 LAB — HEPATIC FUNCTION PANEL: Bilirubin, Direct: 0.22 mg/dL (ref 0.00–0.40)

## 2023-12-24 ENCOUNTER — Encounter: Payer: Self-pay | Admitting: Family Medicine

## 2023-12-25 ENCOUNTER — Encounter: Payer: Self-pay | Admitting: Family Medicine

## 2023-12-26 MED ORDER — LISINOPRIL 10 MG PO TABS: 10.0000 mg | ORAL_TABLET | Freq: Every day | ORAL | 3 refills | Status: DC

## 2024-01-01 ENCOUNTER — Other Ambulatory Visit: Payer: Self-pay | Admitting: Family Medicine

## 2024-01-01 DIAGNOSIS — Z8249 Family history of ischemic heart disease and other diseases of the circulatory system: Secondary | ICD-10-CM

## 2024-01-03 ENCOUNTER — Telehealth: Payer: Self-pay

## 2024-01-03 DIAGNOSIS — G4719 Other hypersomnia: Secondary | ICD-10-CM

## 2024-01-03 DIAGNOSIS — R0683 Snoring: Secondary | ICD-10-CM

## 2024-01-03 NOTE — Telephone Encounter (Signed)
 Terri called from the Sleep Center at [facility]. Insurance denied over night sleep study but will approve an at home study. If you would like to go ahead with the at home study please put in a new order for the at home.  If you have questions, please call her on  [phone]  Thank you,  Rosanna Comment, CMA

## 2024-01-04 NOTE — Telephone Encounter (Signed)
 New referral placed for home study.

## 2024-01-04 NOTE — Addendum Note (Signed)
 Addended by: Bevin Bucks, Hargis Vandyne on: 01/04/2024 07:43 AM   Modules accepted: Orders

## 2024-01-05 ENCOUNTER — Emergency Department (HOSPITAL_COMMUNITY)

## 2024-01-05 ENCOUNTER — Emergency Department (HOSPITAL_COMMUNITY)
Admission: EM | Admit: 2024-01-05 | Discharge: 2024-01-05 | Disposition: A | Discharge status: Home / Self Care | Attending: Student | Admitting: Student

## 2024-01-05 DIAGNOSIS — N2889 Other specified disorders of kidney and ureter: Secondary | ICD-10-CM | POA: Diagnosis not present

## 2024-01-05 DIAGNOSIS — R1032 Left lower quadrant pain: Secondary | ICD-10-CM | POA: Diagnosis not present

## 2024-01-05 DIAGNOSIS — R8271 Bacteriuria: Secondary | ICD-10-CM | POA: Insufficient documentation

## 2024-01-05 DIAGNOSIS — N202 Calculus of kidney with calculus of ureter: Secondary | ICD-10-CM | POA: Diagnosis not present

## 2024-01-05 DIAGNOSIS — N200 Calculus of kidney: Secondary | ICD-10-CM | POA: Diagnosis not present

## 2024-01-05 DIAGNOSIS — I10 Essential (primary) hypertension: Secondary | ICD-10-CM | POA: Diagnosis not present

## 2024-01-05 DIAGNOSIS — N23 Unspecified renal colic: Secondary | ICD-10-CM | POA: Diagnosis not present

## 2024-01-05 DIAGNOSIS — K8689 Other specified diseases of pancreas: Secondary | ICD-10-CM | POA: Diagnosis not present

## 2024-01-05 DIAGNOSIS — N201 Calculus of ureter: Secondary | ICD-10-CM

## 2024-01-05 LAB — URINALYSIS, ROUTINE W REFLEX MICROSCOPIC
Bilirubin Urine: NEGATIVE
Glucose, UA: NEGATIVE mg/dL
Ketones, ur: 5 mg/dL — AB
Nitrite: NEGATIVE
Protein, ur: 30 mg/dL — AB
RBC / HPF: >50 RBC/hpf (ref 0–5)
Specific Gravity, Urine: 1.015 (ref 1.005–1.030)
WBC, UA: >50 WBC/hpf (ref 0–5)
pH: 5 (ref 5.0–8.0)

## 2024-01-05 LAB — CBC WITH DIFFERENTIAL/PLATELET
Abs Immature Granulocytes: 0.03 10*3/uL (ref 0.00–0.07)
Basophils Absolute: 0 10*3/uL (ref 0.0–0.1)
Basophils Relative: 0 %
Eosinophils Absolute: 0.6 10*3/uL — ABNORMAL HIGH (ref 0.0–0.5)
Eosinophils Relative: 6 %
HCT: 47.5 % — ABNORMAL HIGH (ref 36.0–46.0)
Hemoglobin: 16.2 g/dL — ABNORMAL HIGH (ref 12.0–15.0)
Immature Granulocytes: 0 %
Lymphocytes Relative: 20 %
Lymphs Abs: 1.9 10*3/uL (ref 0.7–4.0)
MCH: 29.5 pg (ref 26.0–34.0)
MCHC: 34.1 g/dL (ref 30.0–36.0)
MCV: 86.5 fL (ref 80.0–100.0)
Monocytes Absolute: 0.9 10*3/uL (ref 0.1–1.0)
Monocytes Relative: 9 %
Neutro Abs: 6.2 10*3/uL (ref 1.7–7.7)
Neutrophils Relative %: 65 %
Platelets: 327 10*3/uL (ref 150–400)
RBC: 5.49 MIL/uL — ABNORMAL HIGH (ref 3.87–5.11)
RDW: 12.3 % (ref 11.5–15.5)
WBC: 9.6 10*3/uL (ref 4.0–10.5)
nRBC: 0 % (ref 0.0–0.2)

## 2024-01-05 LAB — BASIC METABOLIC PANEL WITH GFR
Anion gap: 12 (ref 5–15)
BUN: 9 mg/dL (ref 8–23)
CO2: 26 mmol/L (ref 22–32)
Calcium: 9.1 mg/dL (ref 8.9–10.3)
Chloride: 101 mmol/L (ref 98–111)
Creatinine, Ser: 0.93 mg/dL (ref 0.44–1.00)
GFR, Estimated: >60 mL/min (ref >60)
Glucose, Bld: 116 mg/dL — ABNORMAL HIGH (ref 70–99)
Potassium: 3.5 mmol/L (ref 3.5–5.1)
Sodium: 139 mmol/L (ref 135–145)

## 2024-01-05 LAB — HEPATIC FUNCTION PANEL
ALT: 33 U/L (ref 0–44)
AST: 29 U/L (ref 15–41)
Albumin: 4.1 g/dL (ref 3.5–5.0)
Alkaline Phosphatase: 65 U/L (ref 38–126)
Bilirubin, Direct: 0.3 mg/dL — ABNORMAL HIGH (ref 0.0–0.2)
Indirect Bilirubin: 0.8 mg/dL (ref 0.3–0.9)
Total Bilirubin: 1.1 mg/dL (ref 0.0–1.2)
Total Protein: 6.9 g/dL (ref 6.5–8.1)

## 2024-01-05 MED ORDER — OXYCODONE-ACETAMINOPHEN 5-325 MG PO TABS
1.0000 | ORAL_TABLET | Freq: Once | ORAL | Status: AC
  Administered 2024-01-05: 1 via ORAL
  Filled 2024-01-05: qty 1

## 2024-01-05 MED ORDER — TAMSULOSIN HCL 0.4 MG PO CAPS: 0.4000 mg | ORAL_CAPSULE | Freq: Every day | ORAL | 0 refills | Status: AC

## 2024-01-05 MED ORDER — OXYCODONE-ACETAMINOPHEN 5-325 MG PO TABS: 1.0000 | ORAL_TABLET | Freq: Four times a day (QID) | ORAL | 0 refills | Status: AC | PRN

## 2024-01-05 MED ORDER — SODIUM CHLORIDE 0.9 % IV BOLUS
1000.0000 mL | Freq: Once | INTRAVENOUS | Status: AC
  Administered 2024-01-05: 1000 mL via INTRAVENOUS

## 2024-01-05 MED ORDER — ONDANSETRON 4 MG PO TBDP
4.0000 mg | ORAL_TABLET | Freq: Once | ORAL | Status: AC
  Administered 2024-01-05: 4 mg via ORAL
  Filled 2024-01-05: qty 1

## 2024-01-05 MED ORDER — KETOROLAC TROMETHAMINE 15 MG/ML IJ SOLN
15.0000 mg | Freq: Once | INTRAMUSCULAR | Status: AC
  Administered 2024-01-05: 15 mg via INTRAVENOUS
  Filled 2024-01-05: qty 1

## 2024-01-05 MED ORDER — ONDANSETRON HCL 4 MG/2ML IJ SOLN
4.0000 mg | Freq: Once | INTRAMUSCULAR | Status: AC
  Administered 2024-01-05: 4 mg via INTRAVENOUS
  Filled 2024-01-05: qty 2

## 2024-01-05 MED ORDER — SODIUM CHLORIDE 0.9 % IV SOLN
1.0000 g | Freq: Once | INTRAVENOUS | Status: AC
  Administered 2024-01-05: 1 g via INTRAVENOUS
  Filled 2024-01-05: qty 10

## 2024-01-05 MED ORDER — KETOROLAC TROMETHAMINE 10 MG PO TABS: 10.0000 mg | ORAL_TABLET | Freq: Four times a day (QID) | ORAL | 0 refills | Status: AC | PRN

## 2024-01-05 MED ORDER — CEFADROXIL 500 MG PO CAPS: 500.0000 mg | ORAL_CAPSULE | Freq: Two times a day (BID) | ORAL | 0 refills | Status: AC

## 2024-01-05 NOTE — ED Notes (Signed)
 Lab tech notified to add Hepatic Funstion panal .

## 2024-01-05 NOTE — ED Triage Notes (Signed)
 Pt POV with husband d/t ABD - pain right flank that wraps around to ABD with some N/V.  Pt states pain is worse after she eats.

## 2024-01-05 NOTE — ED Provider Triage Note (Signed)
 Emergency Medicine Provider Triage Evaluation Note  Joy Yates , a 63 y.o. female  was evaluated in triage.  Pt complains of right flank pain that is on and off for the past week.  Patient states that it comes and goes without cause.  Patient that she does not have her appendix anymore but states it feels similar.  Patient says she has been nauseous.  Patient is unsure of any urinary symptoms.  Patient does not have any history of kidney stones but states that her pain goes from the right flank down to her right groin.  Review of Systems  Positive:  Negative:   Physical Exam  BP (!) 206/103 (BP Location: Right Arm)   Pulse 74   Temp 97.9 F (36.6 C)   Resp (!) 22   SpO2 100%  Gen:   Awake, in distress Resp:  Normal effort  MSK:   Moves extremities without difficulty  Other:  Right CVA tenderness present  Medical Decision Making  Medically screening exam initiated at 7:28 PM.  Appropriate orders placed.  Girl E Hoelting was informed that the remainder of the evaluation will be completed by another provider, this initial triage assessment does not replace that evaluation, and the importance of remaining in the ED until their evaluation is complete.  Workup initiated, patient is hypertensive at 206/103 which could be secondary to pain however will recheck, do suspect kidney stone given physical exam and history.   Denese Finn, PA-C 01/05/24 1929

## 2024-01-05 NOTE — Discharge Instructions (Addendum)
 Your urine looks a little bit infected, I spoke with the urologist, they recommended you be discharged, but be very careful, if you develop with fever, intractable nausea, vomiting, or feel like your symptoms are worsening please return to the ER, if your urine becomes more infected, there is a chance he can go to your bloodstream, so be very careful and monitor for the symptoms.  Please strain your urine, to check for when you evacuate the stone.  Follow-up with urology, for further evaluation, believe that you have multiple stones, that will likely need to be managed

## 2024-01-05 NOTE — ED Provider Notes (Signed)
 [place] EMERGENCY DEPARTMENT AT [facility] Provider Note   CSN: [id number] Arrival date & time: 01/05/24  1922     History  Chief Complaint  Patient presents with   Abdominal Pain    Joy Yates is a 63 y.o. female, history of hypertension, polycythemia, who presents to the ED secondary to right flank pain that is been going on for several months, she states it radiates from her right lower abdomen, to her back, it has been going for months, but got worse today.  She states her pain is a 10 out of 10, and has associated nausea.  Pain is worse after eating, and drinking.  Denies any kind of abnormal stools or constipation.  Denies any fevers or chills.  Pain is sharp and stabbing, and was initially intermittent, but now is constant. Denies any IV drug use or immunosuppressants   Home Medications Prior to Admission medications   Medication Sig Start Date End Date Taking? Authorizing Provider  cefadroxil (DURICEF) 500 MG capsule Take 1 capsule (500 mg total) by mouth 2 (two) times daily. 01/05/24  Yes Rayel Santizo L, PA  ketorolac  (TORADOL ) 10 MG tablet Take 1 tablet (10 mg total) by mouth every 6 (six) hours as needed. 01/05/24  Yes Alfredo Spong L, PA  oxyCODONE -acetaminophen  (PERCOCET/ROXICET) 5-325 MG tablet Take 1 tablet by mouth every 6 (six) hours as needed for severe pain (pain score 7-10). 01/05/24  Yes Jahmel Flannagan L, PA  tamsulosin (FLOMAX) 0.4 MG CAPS capsule Take 1 capsule (0.4 mg total) by mouth daily after supper. 01/05/24  Yes Huber Mathers L, PA  amoxicillin  (AMOXIL ) 875 MG tablet Take 1 tablet (875 mg total) by mouth 2 (two) times daily. 11/15/23   Azell Boll, MD  Cholecalciferol (VITAMIN D3) 2000 units TABS Take 1 tablet by mouth 2 (two) times daily. Patient not taking: Reported on 11/15/2023    [provider]  docusate sodium  (COLACE) 100 MG capsule Take 1 tablet once or twice daily as needed for constipation while taking narcotic pain  medicine Patient not taking: Reported on 11/15/2023 06/19/22   Lynnda Sas, MD  folic acid (FOLVITE) 1 MG tablet Take 1 mg by mouth daily. Patient not taking: Reported on 11/15/2023    [provider]  lisinopril  (ZESTRIL ) 10 MG tablet Take 1 tablet (10 mg total) by mouth at bedtime. 12/26/23   Azell Boll, MD  omeprazole  (PRILOSEC) 40 MG capsule Take 1 capsule (40 mg total) by mouth daily. 12/21/23 02/15/24  Everhart, Kirstie, DO  ondansetron  (ZOFRAN -ODT) 4 MG disintegrating tablet Allow 1-2 tablets to dissolve in your mouth every 8 hours as needed for nausea/vomiting 12/13/23   Azell Boll, MD  rosuvastatin  (CRESTOR ) 5 MG tablet Take 1 tablet (5 mg total) by mouth daily. 11/15/23   Azell Boll, MD  [facility] PO Take by mouth.    [provider]      Allergies    Patient has no known allergies.    Review of Systems   Review of Systems  Gastrointestinal:  Positive for abdominal pain and nausea. Negative for vomiting.    Physical Exam Updated Vital Signs BP (!) 153/91   Pulse (!) 59   Temp 97.9 F (36.6 C)   Resp 20   Ht 5\' 5"  (1.651 m)   Wt 86.5 kg   SpO2 95%   BMI 31.75 kg/m  Physical Exam Vitals and nursing note reviewed.  Constitutional:  General: She is not in acute distress.    Appearance: She is well-developed.  HENT:     Head: Normocephalic and atraumatic.  Eyes:     Conjunctiva/sclera: Conjunctivae normal.  Cardiovascular:     Rate and Rhythm: Normal rate and regular rhythm.     Heart sounds: No murmur heard. Pulmonary:     Effort: Pulmonary effort is normal. No respiratory distress.     Breath sounds: Normal breath sounds.  Abdominal:     Palpations: Abdomen is soft.     Tenderness: There is abdominal tenderness in the left lower quadrant. There is left CVA tenderness. There is no guarding.  Musculoskeletal:        General: No swelling.     Cervical back: Neck supple.  Skin:    General: Skin is warm and dry.     Capillary  Refill: Capillary refill takes less than 2 seconds.  Neurological:     Mental Status: She is alert.  Psychiatric:        Mood and Affect: Mood normal.     ED Results / Procedures / Treatments   Labs (all labs ordered are listed, but only abnormal results are displayed) Labs Reviewed  BASIC METABOLIC PANEL WITH GFR - Abnormal; Notable for the following components:      Result Value   Glucose, Bld 116 (*)    All other components within normal limits  CBC WITH DIFFERENTIAL/PLATELET - Abnormal; Notable for the following components:   RBC 5.49 (*)    Hemoglobin 16.2 (*)    HCT 47.5 (*)    Eosinophils Absolute 0.6 (*)    All other components within normal limits  URINALYSIS, ROUTINE W REFLEX MICROSCOPIC - Abnormal; Notable for the following components:   APPearance HAZY (*)    Hgb urine dipstick LARGE (*)    Ketones, ur 5 (*)    Protein, ur 30 (*)    Leukocytes,Ua MODERATE (*)    Bacteria, UA FEW (*)    All other components within normal limits  HEPATIC FUNCTION PANEL - Abnormal; Notable for the following components:   Bilirubin, Direct 0.3 (*)    All other components within normal limits  URINE CULTURE    EKG None  Radiology CT Renal Stone Study Result Date: 01/05/2024 CLINICAL DATA:  Abdominal/flank pain, stone suspected EXAM: CT ABDOMEN AND PELVIS WITHOUT CONTRAST TECHNIQUE: Multidetector CT imaging of the abdomen and pelvis was performed following the standard protocol without IV contrast. RADIATION DOSE REDUCTION: This exam was performed according to the departmental dose-optimization program which includes automated exposure control, adjustment of the mA and/or kV according to patient size and/or use of iterative reconstruction technique. COMPARISON:  CT angio chest 06/19/2022 FINDINGS: Lower chest: No acute abnormality. Hepatobiliary: No focal liver abnormality. No gallstones, gallbladder wall thickening, or pericholecystic fluid. No biliary dilatation. Pancreas: Diffusely  atrophic. No focal lesion. Otherwise normal pancreatic contour. No surrounding inflammatory changes. No main pancreatic ductal dilatation. Spleen: Normal in size without focal abnormality. Adrenals/Urinary Tract: No adrenal nodule bilaterally. Bilateral nephrolithiasis measuring up to 2 mm. Mild fullness of the right renal calices. 4 mm calcified stone within the right renal pelvis just proximal to the right ureteropelvic junction. No right ureterolithiasis. No right hydroureter. No left ureterolithiasis. No left hydroureteronephrosis. The urinary bladder is unremarkable. Stomach/Bowel: Stomach is within normal limits. No evidence of bowel wall thickening or dilatation. The appendix is not definitely identified with no inflammatory changes in the right lower quadrant to suggest acute appendicitis. Vascular/Lymphatic: No  abdominal aorta or iliac aneurysm. Mild atherosclerotic plaque of the aorta and its branches. No abdominal, pelvic, or inguinal lymphadenopathy. Reproductive: Uterus and bilateral adnexa are unremarkable. Other: No intraperitoneal free fluid. No intraperitoneal free gas. No organized fluid collection. Musculoskeletal: Right gluteal soft tissue density measuring 3 cm. No suspicious lytic or blastic osseous lesions. No acute displaced fracture. Multilevel degenerative changes of the spine. IMPRESSION: 1. Minimally obstructive 4 mm right renal pelvis/ureteropelvic junction stone. 2. Nonobstructive bilateral nephrolithiasis measuring up to 2 mm. 3.  Aortic Atherosclerosis (ICD10-I70.0). 4. Right gluteal soft tissue density measuring 3 cm. Correlate with physical exam. Electronically Signed   By: Morgane  Naveau M.D.   On: 01/05/2024 20:50    Procedures Procedures    Medications Ordered in ED Medications  oxyCODONE -acetaminophen  (PERCOCET/ROXICET) 5-325 MG per tablet 1 tablet (1 tablet Oral Given 01/05/24 1945)  ondansetron  (ZOFRAN -ODT) disintegrating tablet 4 mg (4 mg Oral Given 01/05/24 1945)   ketorolac  (TORADOL ) 15 MG/ML injection 15 mg (15 mg Intravenous Given 01/05/24 2132)  ondansetron  (ZOFRAN ) injection 4 mg (4 mg Intravenous Given 01/05/24 2132)  sodium chloride  0.9 % bolus 1,000 mL (0 mLs Intravenous Stopped 01/05/24 2218)  cefTRIAXone (ROCEPHIN) 1 g in sodium chloride  0.9 % 100 mL IVPB (0 g Intravenous Stopped 01/05/24 2218)    ED Course/ Medical Decision Making/ A&P                                 Medical Decision Making Patient is a 63 year old female, here for right flank pain, it has been going on for months, worse today.  Previously was waxing and waning, now it is constant.  She has had some nausea, and pain is especially worse after eating and drinking.  Tenderness to palpation of the right flank, and lower abdomen.  Will order CT BC, CMP, CT renal for further evaluation, suspicious for kidney stone.  LFT panel, and ordered, to rule out gallbladder etiology  Amount and/or Complexity of Data Reviewed Labs: ordered.    Details: Hazy urine, with moderate leukocytes and few bacteria, no leukocytosis on labs Radiology:     Details: CT renal shows minimally obstructive 4 mm right renal UPJ stone, and nonobstructing stones in bilateral kidneys Discussion of management or test interpretation with external provider(s): Discussed with Dr. Pinkey Brier, he states likely not septic stone, but recommends going ahead and treating with Cipro or cephalosporin for treatment for 7 days.  He advised to tell patient on strict return precautions, including fever, intractable nausea, vomiting, worsening symptoms, or confusion.  As she has the ability to progress to a septic stone.  I discussed this with the patient at great length, prescribed her some Duricef, for her infection, as well as Flomax, Percocet, and Toradol  for pain control.  Additionally we discussed that the lesion on her bottom, she states that she has had this for years, since she was 63 years old, and this has already been checked out,  she is aware of this incidental finding.   Risk Prescription drug management.   Final Clinical Impression(s) / ED Diagnoses Final diagnoses:  Right ureteral stone  Bacteriuria    Rx / DC Orders ED Discharge Orders          Ordered    tamsulosin (FLOMAX) 0.4 MG CAPS capsule  Daily after supper        01/05/24 2234    oxyCODONE -acetaminophen  (PERCOCET/ROXICET) 5-325 MG tablet  Every 6 hours PRN  01/05/24 2234    ketorolac  (TORADOL ) 10 MG tablet  Every 6 hours PRN        01/05/24 2234    cefadroxil (DURICEF) 500 MG capsule  2 times daily        01/05/24 2234              Nitya Cauthon, Dwaine Gip, PA 01/05/24 2317    Karlyn Overman, MD 01/06/24 [phone]

## 2024-01-08 LAB — URINE CULTURE: Culture: 60000 — AB

## 2024-01-09 ENCOUNTER — Telehealth (HOSPITAL_BASED_OUTPATIENT_CLINIC_OR_DEPARTMENT_OTHER): Payer: Self-pay | Admitting: *Deleted

## 2024-01-09 DIAGNOSIS — N201 Calculus of ureter: Secondary | ICD-10-CM | POA: Diagnosis not present

## 2024-01-09 NOTE — Telephone Encounter (Signed)
 Post ED Visit - Positive Culture Follow-up  Culture report reviewed by antimicrobial stewardship pharmacist: [facility] []  Jose Ngo, [place].D. []  Skeet Duke, Pharm.D., BCPS AQ-ID []  Leslee Rase, Pharm.D., BCPS []  Garland Junk, Pharm.D., BCPS []  [place], [place].D., BCPS, AAHIVP []  Alcide Aly, Pharm.D., BCPS, AAHIVP []  Jerri Morale, PharmD, BCPS []  Graham Laws, PharmD, BCPS []  Cleda Curly, PharmD, BCPS []  Tamar Fairly, PharmD []  Ballard Levels, PharmD, BCPS []  Ollen Beverage, PharmD  [facility] []  Arlyne Bering, PharmD []  Sherryle Don, PharmD []  Van Gelinas, PharmD []  Delila Felty, Rph []  [place]) Cleora Daft, PharmD []  Augustina Block, PharmD []  Arie Kurtz, PharmD []  Sharlyn Deaner, PharmD []  Agnes Hose, PharmD []  Kendall Pauls, PharmD []  Gladstone Lamer, PharmD []  Armanda Bern, PharmD []  Tera Fellows, PharmD   Positive urine culture Treated with cefadroxil, organism sensitive to the same and no further patient follow-up is required at this time.  Lovell Rubenstein Sharion Davidson 01/09/2024, 10:13 AM

## 2024-01-10 ENCOUNTER — Other Ambulatory Visit: Payer: Self-pay | Admitting: Urology

## 2024-01-14 NOTE — Patient Instructions (Signed)
 SURGICAL WAITING ROOM VISITATION  Patients having surgery or a procedure may have no more than 2 support people in the waiting area - these visitors may rotate.    Children under the age of 30 must have an adult with them who is not the patient.  Due to an increase in RSV and influenza rates and associated hospitalizations, children ages 80 and under may not visit patients in [facility] hospitals.  Visitors with respiratory illnesses are discouraged from visiting and should remain at home.  If the patient needs to stay at the hospital during part of their recovery, the visitor guidelines for inpatient rooms apply. Pre-op nurse will coordinate an appropriate time for 1 support person to accompany patient in pre-op.  This support person may not rotate.    Please refer to the [facility] website for the visitor guidelines for Inpatients (after your surgery is over and you are in a regular room).       Your procedure is scheduled on: 01/17/24   Report to [facility] Main Entrance    Report to admitting at : 8:45 AM   Call this number if you have problems the morning of surgery [phone]   Do not eat food :After Midnight.   After Midnight you may have the following liquids until : 8:00 AM DAY OF SURGERY  Water Non-Citrus Juices (without pulp, NO RED-Apple, White grape, White cranberry) Black Coffee (NO MILK/CREAM OR CREAMERS, sugar ok)  Clear Tea (NO MILK/CREAM OR CREAMERS, sugar ok) regular and decaf                             Plain Jell-O (NO RED)                                           Fruit ices (not with fruit pulp, NO RED)                                     Popsicles (NO RED)                                                               Sports drinks like Gatorade (NO RED)              FOLLOW ANY ADDITIONAL PRE OP INSTRUCTIONS YOU RECEIVED FROM YOUR SURGEON'S OFFICE!!!   Oral Hygiene is also important to reduce your risk of infection.                                     Remember - BRUSH YOUR TEETH THE MORNING OF SURGERY WITH YOUR REGULAR TOOTHPASTE  DENTURES WILL BE REMOVED PRIOR TO SURGERY PLEASE DO NOT APPLY "Poly grip" OR ADHESIVES!!!   Do NOT smoke after Midnight   Stop all vitamins and herbal supplements 7 days before surgery.   Take these medicines the morning of surgery with A SIP OF WATER: cefadroxil (duricef),omeprazole .  You may not have any metal on your body including hair pins, jewelry, and body piercing             Do not wear make-up, lotions, powders, perfumes/cologne, or deodorant  Do not wear nail polish including gel and S&S, artificial/acrylic nails, or any other type of covering on natural nails including finger and toenails. If you have artificial nails, gel coating, etc. that needs to be removed by a nail salon please have this removed prior to surgery or surgery may need to be canceled/ delayed if the surgeon/ anesthesia feels like they are unable to be safely monitored.   Do not shave  48 hours prior to surgery.    Do not bring valuables to the hospital. [facility] IS NOT             RESPONSIBLE   FOR VALUABLES.   Contacts, glasses, dentures or bridgework may not be worn into surgery.   Bring small overnight bag day of surgery.   DO NOT BRING YOUR HOME MEDICATIONS TO THE HOSPITAL. PHARMACY WILL DISPENSE MEDICATIONS LISTED ON YOUR MEDICATION LIST TO YOU DURING YOUR ADMISSION IN THE HOSPITAL!    Patients discharged on the day of surgery will not be allowed to drive home.  Someone NEEDS to stay with you for the first 24 hours after anesthesia.   Special Instructions: Bring a copy of your healthcare power of attorney and living will documents the day of surgery if you haven't scanned them before.              Please read over the following fact sheets you were given: IF YOU HAVE QUESTIONS ABOUT YOUR PRE-OP INSTRUCTIONS PLEASE CALL [phone]   If you received a COVID test during your  pre-op visit  it is requested that you wear a mask when out in public, stay away from anyone that may not be feeling well and notify your surgeon if you develop symptoms. If you test positive for Covid or have been in contact with anyone that has tested positive in the last 10 days please notify you surgeon.    [facility] - Preparing for Surgery Before surgery, you can play an important role.  Because skin is not sterile, your skin needs to be as free of germs as possible.  You can reduce the number of germs on your skin by washing with CHG (chlorahexidine gluconate) soap before surgery.  CHG is an antiseptic cleaner which kills germs and bonds with the skin to continue killing germs even after washing. Please DO NOT use if you have an allergy to CHG or antibacterial soaps.  If your skin becomes reddened/irritated stop using the CHG and inform your nurse when you arrive at Short Stay. Do not shave (including legs and underarms) for at least 48 hours prior to the first CHG shower.  You may shave your face/neck. Please follow these instructions carefully:  1.  Shower with CHG Soap the night before surgery and the  morning of Surgery.  2.  If you choose to wash your hair, wash your hair first as usual with your  normal  shampoo.  3.  After you shampoo, rinse your hair and body thoroughly to remove the  shampoo.                           4.  Use CHG as you would any other liquid soap.  You can apply chg directly  to  the skin and wash                       Gently with a scrungie or clean washcloth.  5.  Apply the CHG Soap to your body ONLY FROM THE NECK DOWN.   Do not use on face/ open                           Wound or open sores. Avoid contact with eyes, ears mouth and genitals (private parts).                       Wash face,  Genitals (private parts) with your normal soap.             6.  Wash thoroughly, paying special attention to the area where your surgery  will be performed.  7.  Thoroughly rinse  your body with warm water from the neck down.  8.  DO NOT shower/wash with your normal soap after using and rinsing off  the CHG Soap.                9.  Pat yourself dry with a clean towel.            10.  Wear clean pajamas.            11.  Place clean sheets on your bed the night of your first shower and do not  sleep with pets. Day of Surgery : Do not apply any lotions/deodorants the morning of surgery.  Please wear clean clothes to the hospital/surgery center.  FAILURE TO FOLLOW THESE INSTRUCTIONS MAY RESULT IN THE CANCELLATION OF YOUR SURGERY PATIENT SIGNATURE_________________________________  NURSE SIGNATURE__________________________________  ________________________________________________________________________

## 2024-01-16 ENCOUNTER — Encounter (HOSPITAL_COMMUNITY)
Admission: RE | Admit: 2024-01-16 | Discharge: 2024-01-16 | Disposition: A | Discharge status: Home / Self Care | Source: Ambulatory Visit | Attending: Urology | Admitting: Urology

## 2024-01-16 ENCOUNTER — Encounter (HOSPITAL_COMMUNITY): Payer: Self-pay

## 2024-01-16 ENCOUNTER — Other Ambulatory Visit: Payer: Self-pay

## 2024-01-16 VITALS — BP 157/109 | HR 68 | Temp 98.2°F | Ht 65.0 in | Wt 184.0 lb

## 2024-01-16 DIAGNOSIS — Z01810 Encounter for preprocedural cardiovascular examination: Secondary | ICD-10-CM | POA: Insufficient documentation

## 2024-01-16 DIAGNOSIS — I10 Essential (primary) hypertension: Secondary | ICD-10-CM | POA: Diagnosis not present

## 2024-01-16 DIAGNOSIS — E669 Obesity, unspecified: Secondary | ICD-10-CM | POA: Diagnosis not present

## 2024-01-16 DIAGNOSIS — K219 Gastro-esophageal reflux disease without esophagitis: Secondary | ICD-10-CM | POA: Diagnosis not present

## 2024-01-16 DIAGNOSIS — Z6830 Body mass index (BMI) 30.0-30.9, adult: Secondary | ICD-10-CM | POA: Diagnosis not present

## 2024-01-16 DIAGNOSIS — Z79899 Other long term (current) drug therapy: Secondary | ICD-10-CM | POA: Diagnosis not present

## 2024-01-16 DIAGNOSIS — N201 Calculus of ureter: Secondary | ICD-10-CM | POA: Diagnosis not present

## 2024-01-16 HISTORY — DX: Personal history of urinary calculi: Z87.442

## 2024-01-16 HISTORY — DX: Pneumonia, unspecified organism: J18.9

## 2024-01-16 NOTE — Progress Notes (Signed)
 For Anesthesia: PCP - Azell Boll, MD  Cardiologist - N/A  Bowel Prep reminder:  Chest x-ray -  EKG - 01/16/24 Stress Test -  ECHO -  Cardiac Cath -  Pacemaker/ICD device last checked: Pacemaker orders received: Device Rep notified:  Spinal Cord Stimulator:N/A  Sleep Study - N/A CPAP -   Fasting Blood Sugar - N/A Checks Blood Sugar _____ times a day Date and result of last Hgb A1c-  Last dose of GLP1 agonist- N/A GLP1 instructions:   Last dose of SGLT-2 inhibitors- N/A SGLT-2 instructions:   Blood Thinner Instructions:N/A Aspirin Instructions: Last Dose:  Activity level: Can go up a flight of stairs and activities of daily living without stopping and without chest pain and/or shortness of breath   Able to exercise without chest pain and/or shortness of breath  Anesthesia review: Hx: HTN  Patient denies shortness of breath, fever, cough and chest pain at PAT appointment   Patient verbalized understanding of instructions that were given to them at the PAT appointment. Patient was also instructed that they will need to review over the PAT instructions again at home before surgery.

## 2024-01-16 NOTE — H&P (Signed)
 3 F w/ R ureteral stone seen in ER on 5/24. 4 mm R proximal stone can be seen on KUB. Cr 0.93, WBC wnl, UA no concern for infection.   PMH: MDD, GERD, HLD, HTN, no Cards hx, no Pulm hx.  NWG:NFAOZHYQ, and hemorrhoid surgery   R renal stone:  01/09/24: patient still having pain, no Fever or chills, HR 101, some nausea no vomiting. Pt on Ketorolac , flomax , and oxycodone . No prior kidney stones. KUB today shows a stone at the send is slightly but minimal amount. Patient does not want to pass stone would like to get stone treated we will set up for URS.     ALLERGIES: None   MEDICATIONS: Ketorolac  Tromethamine   Lisinopril  10 MG Tablet  Tamsulosin  HCl 0.4 MG Capsule  ZyrTEC  Cefadroxil   oxyCODONE  HCl     GU PSH: None   NON-GU PSH: Appendectomy (laparoscopic) - about 2019 Tonsillectomy..     GU PMH: None   NON-GU PMH: GERD Hypercholesterolemia Hypertension    FAMILY HISTORY: Aneurysm - Mother Heart Attack - Mother Hypertension - Runs in Family   SOCIAL HISTORY: Marital Status: Married Current Smoking Status: Patient has never smoked.   Tobacco Use Assessment Completed: Used Tobacco in last 30 days? Light Drinker.  Drinks 1 caffeinated drink per day.    REVIEW OF SYSTEMS:    GU Review Female:   Patient reports get up at night to urinate and leakage of urine. Patient denies frequent urination, hard to postpone urination, burning /pain with urination, stream starts and stops, trouble starting your stream, have to strain to urinate, and being pregnant.  Gastrointestinal (Upper):   Patient reports nausea, vomiting, and indigestion/ heartburn.   Gastrointestinal (Lower):   Patient denies diarrhea and constipation.  Constitutional:   Patient denies fever, night sweats, weight loss, and fatigue.  Skin:   Patient denies skin rash/ lesion and itching.  Eyes:   Patient denies blurred vision and double vision.  Ears/ Nose/ Throat:   Patient denies sore throat and sinus problems.   Hematologic/Lymphatic:   Patient denies swollen glands and easy bruising.  Cardiovascular:   Patient denies leg swelling and chest pains.  Respiratory:   Patient denies cough and shortness of breath.  Endocrine:   Patient denies excessive thirst.  Musculoskeletal:   Patient reports back pain. Patient denies joint pain.  Neurological:   Patient denies headaches and dizziness.  Psychologic:   Patient denies depression and anxiety.   VITAL SIGNS:      01/09/2024 12:05 PM  BP 145/89 mmHg  Pulse 62 /min  Temperature 97.7 F / 36.5 C   MULTI-SYSTEM PHYSICAL EXAMINATION:    Constitutional: Well-nourished. No physical deformities. Normally developed. Good grooming.  Respiratory: No labored breathing, no use of accessory muscles.   Cardiovascular: Normal temperature, normal extremity pulses, no swelling, no varicosities.  Skin: No paleness, no jaundice, no cyanosis. No lesion, no ulcer, no rash.  Musculoskeletal: Normal gait and station of head and neck., Some CVA tenderness on the right side.     Complexity of Data:  Source Of History:  Patient  Records Review:   Previous Patient Records  Urine Test Review:   Urinalysis  X-Ray Review: KUB: Reviewed Films. Discussed With Patient. CTKUB has shown stones distended minimally scared but this would be visualized on the KUB. C.T. Abdomen/Pelvis: Reviewed Films. Reviewed Report. Right proximal ureteral stone April corresponded visualized on the stat scout film. Mild hydronephrosis right side. Small punctate stones on the left side small punctate  stones in the right kidney as well no other abnormalities noted.    PROCEDURES:         KUB - [id number]  A single view of the abdomen is obtained.      Patient confirmed No Neulasta OnPro Device.           Visit Complexity - G2211          Urinalysis w/Scope Dipstick Dipstick Cont'd Micro  Color: Yellow Bilirubin: Neg mg/dL WBC/hpf: NS (Not Seen)  Appearance: Clear Ketones: Neg mg/dL RBC/hpf: 20 -  16/XWR  Specific Gravity: 1.025 Blood: 3+ ery/uL Bacteria: Rare (0-9/hpf)  pH: 6.0 Protein: 1+ mg/dL Cystals: NS (Not Seen)  Glucose: Neg mg/dL Urobilinogen: 1.0 mg/dL Casts: NS (Not Seen)    Nitrites: Neg Trichomonas: Not Present    Leukocyte Esterase: Neg leu/uL Mucous: Present      Epithelial Cells: 0 - 5/hpf      Yeast: NS (Not Seen)      Sperm: Not Present    ASSESSMENT:      ICD-10 Details  1 GU:   Ureteral calculus - N20.1 Undiagnosed New Problem   PLAN:           Orders Labs Urine Culture  X-Rays: KUB          Schedule         Document Letter(s):  Created for Patient: Clinical Summary         Notes:   Right ureteral stone: Patient is in pain would like to have stone addressed. Trial of passage. We discussed ESWL as well as ureteroscopy. Explained the stone free rates of higher stone free rate pain associated with ureteroscopy. Because of this patient wanted to pursue ureteroscopy.   Plan for ureteroscopy today.  Urine culture negative   We discussed the risk benefits and alternatives to ureteroscopy. This includes bleeding, infection, damage to surrounding structures including the urethra, bladder, ureter, and kidney. With these possible injuries resulting in need for intervention in the future. We discussed inability to remove all the stone and requiring follow-up ureteroscopy. We also discussed the possibility of not being able to gain access to the kidney and the need for nephrostomy tube. Possibility of long-term stent was also discussed. The patient voiced their understanding and would like to proceed.

## 2024-01-17 ENCOUNTER — Ambulatory Visit (HOSPITAL_COMMUNITY)
Admission: RE | Admit: 2024-01-17 | Discharge: 2024-01-17 | Disposition: A | Discharge status: Home / Self Care | Attending: Urology | Admitting: Urology

## 2024-01-17 ENCOUNTER — Ambulatory Visit (HOSPITAL_COMMUNITY): Admitting: Anesthesiology

## 2024-01-17 ENCOUNTER — Encounter (HOSPITAL_COMMUNITY): Payer: Self-pay | Admitting: Urology

## 2024-01-17 ENCOUNTER — Encounter (HOSPITAL_COMMUNITY)
Admission: RE | Disposition: A | Discharge status: Home / Self Care | Payer: Self-pay | Source: Home / Self Care | Attending: Urology

## 2024-01-17 ENCOUNTER — Ambulatory Visit (HOSPITAL_COMMUNITY)

## 2024-01-17 DIAGNOSIS — N201 Calculus of ureter: Secondary | ICD-10-CM | POA: Insufficient documentation

## 2024-01-17 DIAGNOSIS — I10 Essential (primary) hypertension: Secondary | ICD-10-CM | POA: Insufficient documentation

## 2024-01-17 DIAGNOSIS — E669 Obesity, unspecified: Secondary | ICD-10-CM | POA: Diagnosis not present

## 2024-01-17 DIAGNOSIS — Z6830 Body mass index (BMI) 30.0-30.9, adult: Secondary | ICD-10-CM | POA: Diagnosis not present

## 2024-01-17 DIAGNOSIS — K219 Gastro-esophageal reflux disease without esophagitis: Secondary | ICD-10-CM | POA: Insufficient documentation

## 2024-01-17 DIAGNOSIS — Z79899 Other long term (current) drug therapy: Secondary | ICD-10-CM | POA: Diagnosis not present

## 2024-01-17 HISTORY — PX: CYSTOSCOPY/URETEROSCOPY/HOLMIUM LASER/STENT PLACEMENT: SHX6546

## 2024-01-17 HISTORY — PX: CYSTOSCOPY W/ RETROGRADES: SHX1426

## 2024-01-17 SURGERY — CYSTOSCOPY/URETEROSCOPY/HOLMIUM LASER/STENT PLACEMENT
Anesthesia: General | Laterality: Right

## 2024-01-17 MED ORDER — SODIUM CHLORIDE 0.9 % IR SOLN
Status: DC | PRN
  Administered 2024-01-17: 3000 mL

## 2024-01-17 MED ORDER — DEXAMETHASONE SODIUM PHOSPHATE 10 MG/ML IJ SOLN
INTRAMUSCULAR | Status: DC | PRN
  Administered 2024-01-17: 8 mg via INTRAVENOUS

## 2024-01-17 MED ORDER — HYOSCYAMINE SULFATE 0.125 MG PO TBDP: 0.1250 mg | ORAL_TABLET | Freq: Four times a day (QID) | ORAL | 0 refills | Status: AC | PRN

## 2024-01-17 MED ORDER — IOHEXOL 300 MG/ML  SOLN
INTRAMUSCULAR | Status: DC | PRN
  Administered 2024-01-17: 10 mL

## 2024-01-17 MED ORDER — ONDANSETRON HCL 4 MG/2ML IJ SOLN
INTRAMUSCULAR | Status: AC
  Filled 2024-01-17: qty 2

## 2024-01-17 MED ORDER — FENTANYL CITRATE (PF) 100 MCG/2ML IJ SOLN
INTRAMUSCULAR | Status: DC | PRN
  Administered 2024-01-17 (×2): 50 ug via INTRAVENOUS

## 2024-01-17 MED ORDER — PHENYLEPHRINE 80 MCG/ML (10ML) SYRINGE FOR IV PUSH (FOR BLOOD PRESSURE SUPPORT)
PREFILLED_SYRINGE | INTRAVENOUS | Status: AC
  Filled 2024-01-17: qty 10

## 2024-01-17 MED ORDER — HYDROMORPHONE HCL 1 MG/ML IJ SOLN: 0.2500 mg | INTRAMUSCULAR | Status: DC | PRN

## 2024-01-17 MED ORDER — CEFAZOLIN SODIUM-DEXTROSE 2-4 GM/100ML-% IV SOLN
2.0000 g | INTRAVENOUS | Status: AC
  Administered 2024-01-17: 2 g via INTRAVENOUS
  Filled 2024-01-17: qty 100

## 2024-01-17 MED ORDER — MIDAZOLAM HCL 5 MG/5ML IJ SOLN
INTRAMUSCULAR | Status: DC | PRN
  Administered 2024-01-17: 2 mg via INTRAVENOUS

## 2024-01-17 MED ORDER — OXYCODONE HCL 5 MG/5ML PO SOLN: 5.0000 mg | Freq: Once | ORAL | Status: DC | PRN

## 2024-01-17 MED ORDER — SODIUM CHLORIDE 0.9 % IR SOLN
Status: DC | PRN
  Administered 2024-01-17: 1000 mL

## 2024-01-17 MED ORDER — DROPERIDOL 2.5 MG/ML IJ SOLN: 0.6250 mg | Freq: Once | INTRAMUSCULAR | Status: DC | PRN

## 2024-01-17 MED ORDER — CHLORHEXIDINE GLUCONATE 0.12 % MT SOLN
15.0000 mL | Freq: Once | OROMUCOSAL | Status: AC
  Administered 2024-01-17: 15 mL via OROMUCOSAL

## 2024-01-17 MED ORDER — ONDANSETRON HCL 4 MG/2ML IJ SOLN
INTRAMUSCULAR | Status: DC | PRN
Start: 2024-01-17 — End: 2024-01-17
  Administered 2024-01-17: 4 mg via INTRAVENOUS

## 2024-01-17 MED ORDER — FENTANYL CITRATE (PF) 100 MCG/2ML IJ SOLN
INTRAMUSCULAR | Status: AC
  Filled 2024-01-17: qty 2

## 2024-01-17 MED ORDER — LIDOCAINE 2% (20 MG/ML) 5 ML SYRINGE: INTRAMUSCULAR | Status: DC | PRN

## 2024-01-17 MED ORDER — EPHEDRINE SULFATE-NACL 50-0.9 MG/10ML-% IV SOSY
PREFILLED_SYRINGE | INTRAVENOUS | Status: DC | PRN
  Administered 2024-01-17: 10 mg via INTRAVENOUS

## 2024-01-17 MED ORDER — LACTATED RINGERS IV SOLN: INTRAVENOUS | Status: DC

## 2024-01-17 MED ORDER — PROPOFOL 10 MG/ML IV BOLUS
INTRAVENOUS | Status: DC | PRN
  Administered 2024-01-17: 170 mg via INTRAVENOUS

## 2024-01-17 MED ORDER — MIDAZOLAM HCL 2 MG/2ML IJ SOLN
INTRAMUSCULAR | Status: AC
  Filled 2024-01-17: qty 2

## 2024-01-17 MED ORDER — ONDANSETRON HCL 4 MG/2ML IJ SOLN: 4.0000 mg | Freq: Once | INTRAMUSCULAR | Status: DC | PRN

## 2024-01-17 MED ORDER — METHOCARBAMOL 750 MG PO TABS: 750.0000 mg | ORAL_TABLET | Freq: Four times a day (QID) | ORAL | 0 refills | Status: AC

## 2024-01-17 MED ORDER — DEXAMETHASONE SODIUM PHOSPHATE 10 MG/ML IJ SOLN
INTRAMUSCULAR | Status: AC
  Filled 2024-01-17: qty 1

## 2024-01-17 MED ORDER — OXYCODONE HCL 5 MG PO TABS: 5.0000 mg | ORAL_TABLET | Freq: Once | ORAL | Status: DC | PRN

## 2024-01-17 MED ORDER — PROPOFOL 10 MG/ML IV BOLUS
INTRAVENOUS | Status: AC
  Filled 2024-01-17: qty 20

## 2024-01-17 MED ORDER — LIDOCAINE HCL (PF) 2 % IJ SOLN
INTRAMUSCULAR | Status: DC | PRN
  Administered 2024-01-17: 80 mg via INTRADERMAL

## 2024-01-17 MED ORDER — LIDOCAINE HCL (PF) 2 % IJ SOLN
INTRAMUSCULAR | Status: AC
  Filled 2024-01-17: qty 5

## 2024-01-17 MED ORDER — ORAL CARE MOUTH RINSE: 15.0000 mL | Freq: Once | OROMUCOSAL | Status: AC

## 2024-01-17 MED ORDER — PHENAZOPYRIDINE HCL 200 MG PO TABS: 200.0000 mg | ORAL_TABLET | Freq: Three times a day (TID) | ORAL | 0 refills | Status: AC | PRN

## 2024-01-17 MED ORDER — TAMSULOSIN HCL 0.4 MG PO CAPS: 0.4000 mg | ORAL_CAPSULE | Freq: Every day | ORAL | 0 refills | Status: AC

## 2024-01-17 SURGICAL SUPPLY — 26 items
BAG COUNTER SPONGE SURGICOUNT (BAG) IMPLANT
BAG URO CATCHER STRL LF (MISCELLANEOUS) ×2 IMPLANT
BASKET ZERO TIP NITINOL 2.4FR (BASKET) IMPLANT
CATH URETL OPEN 5X70 (CATHETERS) ×2 IMPLANT
CLOTH BEACON ORANGE TIMEOUT ST (SAFETY) ×2 IMPLANT
EXTRACTOR STONE 1.7FRX115CM (UROLOGICAL SUPPLIES) IMPLANT
FIBER LASER MOSES 200 DFL (Laser) IMPLANT
FIBER LASER MOSES 365 DFL (Laser) IMPLANT
GLOVE BIO SURGEON STRL SZ8 (GLOVE) ×2 IMPLANT
GOWN STRL REUS W/ TWL XL LVL3 (GOWN DISPOSABLE) ×2 IMPLANT
GUIDEWIRE ANG ZIPWIRE 038X150 (WIRE) IMPLANT
GUIDEWIRE STR DUAL SENSOR (WIRE) ×2 IMPLANT
KIT TURNOVER KIT A (KITS) IMPLANT
LASER FIB FLEXIVA PULSE ID 365 (Laser) IMPLANT
LASER FIB FLEXIVA PULSE ID 550 (Laser) IMPLANT
LASER FIB FLEXIVA PULSE ID 910 (Laser) IMPLANT
MANIFOLD NEPTUNE II (INSTRUMENTS) ×2 IMPLANT
NS IRRIG 1000ML POUR BTL (IV SOLUTION) IMPLANT
PACK CYSTO (CUSTOM PROCEDURE TRAY) ×2 IMPLANT
SHEATH NAVIGATOR HD 11/13X36 (SHEATH) ×2 IMPLANT
SHEATH NAVIGATOR HD 12/14X28 (SHEATH) IMPLANT
SHEATH NAVIGATOR HD 12/14X36 (SHEATH) IMPLANT
STENT URET 6FRX24 CONTOUR (STENTS) IMPLANT
TRACTIP FLEXIVA PULS ID 200XHI (Laser) IMPLANT
TUBING CONNECTING 10 (TUBING) ×2 IMPLANT
TUBING UROLOGY SET (TUBING) ×2 IMPLANT

## 2024-01-17 NOTE — Anesthesia Procedure Notes (Signed)
 Procedure Name: LMA Insertion Date/Time: 01/17/2024 11:18 AM  Performed by: Rufus Council, CRNAPre-anesthesia Checklist: Patient identified, Emergency Drugs available, Suction available, Patient being monitored and Timeout performed Patient Re-evaluated:Patient Re-evaluated prior to induction Oxygen Delivery Method: Circle system utilized Preoxygenation: Pre-oxygenation with 100% oxygen Induction Type: IV induction LMA: LMA inserted LMA Size: 4.0 Number of attempts: 1 Dental Injury: Teeth and Oropharynx as per pre-operative assessment

## 2024-01-17 NOTE — Transfer of Care (Signed)
 Immediate Anesthesia Transfer of Care Note  Patient: Joy Yates  Procedure(s) Performed: CYSTOSCOPY/URETEROSCOPY/HOLMIUM LASER/STENT PLACEMENT (Right) CYSTOSCOPY, WITH RETROGRADE PYELOGRAM (Right)  Patient Location: PACU  Anesthesia Type:General  Level of Consciousness: awake, alert , and oriented  Airway & Oxygen Therapy: Patient Spontanous Breathing and Patient connected to nasal cannula oxygen  Post-op Assessment: Report given to RN and Post -op Vital signs reviewed and stable  Post vital signs: Reviewed and stable  Last Vitals:  Vitals Value Taken Time  BP 159/89 01/17/24 1223  Temp    Pulse 85 01/17/24 1224  Resp 18 01/17/24 1224  SpO2 100 % 01/17/24 1224  Vitals shown include unfiled device data.  Last Pain:  Vitals:   01/17/24 0937  TempSrc:   PainSc: 0-No pain         Complications: No notable events documented.

## 2024-01-17 NOTE — Anesthesia Postprocedure Evaluation (Signed)
 Anesthesia Post Note  Patient: Joy Yates  Procedure(s) Performed: CYSTOSCOPY/URETEROSCOPY/HOLMIUM LASER/STENT PLACEMENT (Right) CYSTOSCOPY, WITH RETROGRADE PYELOGRAM (Right)     Patient location during evaluation: PACU Anesthesia Type: General Level of consciousness: awake and alert and oriented Pain management: pain level controlled Vital Signs Assessment: post-procedure vital signs reviewed and stable Respiratory status: spontaneous breathing, nonlabored ventilation and respiratory function stable Cardiovascular status: blood pressure returned to baseline and stable Postop Assessment: no apparent nausea or vomiting Anesthetic complications: no   No notable events documented.  Last Vitals:  Vitals:   01/17/24 1256 01/17/24 1308  BP: (!) 148/99 (!) 165/98  Pulse: 74 80  Resp: 14   Temp: 36.7 C 36.8 C  SpO2: 94% 97%    Last Pain:  Vitals:   01/17/24 1308  TempSrc:   PainSc: 0-No pain                 Kla Bily A.

## 2024-01-17 NOTE — Discharge Instructions (Addendum)
 DISCHARGE INSTRUCTIONS FOR Ureteroscopy and/or Ureteral Stent Placement  MEDICATIONS:  1.  Robaxin 2. Tamsulosin   3. Hyoscyamine  4. Methocarbamol   ACTIVITY:  1. No strenuous activity x 1week  2. No driving while on narcotic pain medications  3. Drink plenty of water  4. Continue to walk at home - it is normal to see blood in the urine while the stent is in place, so keep active, but don't over do it.  5. May return to work/school tomorrow or when you feel ready  6. You may experience some pain when urinating in the kidney on the side that was operated on while the stent is in place this is normal  WHAT IS NORMAL TO EXPERIENCE: It is normal to feel the urge to urinate while the stent is in place It is normal to have blood in your urine while the stent is in place  It sometimes can be normal to have pain in your kidney when you urinate   BATHING:  1. You can shower and we recommend daily showers  2. You have a string coming from your urethra: The stent string is attached to your ureteral stent. Do not pull on this until instructed.   DIET: You may return to your normal diet immediately. Because of the raw surface of your bladder, alcohol, spicy foods, acid type foods and drinks with caffeine may cause irritation or frequency and should be used in moderation. To keep your urine flowing freely and to avoid constipation, drink plenty of fluids during the day ( 8-10 glasses ). Tip: Avoid cranberry juice because it is very acidic.  SIGNS/SYMPTOMS TO CALL:  Please call us  if you have a fever greater than 101.5, uncontrolled nausea/vomiting, uncontrolled pain, dizziness, unable to urinate, bloody urine with clots greater than the size of a quarter, chest pain, shortness of breath, leg swelling, leg pain, redness around wound, drainage from wound, or any other concerns or questions.   You can reach us  at [phone].   FOLLOW-UP:  1. You may remove your stent in on Monday 01/21/24. To do  this go into the shower, grab hold of the tether coming from your urethra. Pull the tether consistent motion until the stent is removed from your body. The stent will be around 10 inches long with a curl on either end.  2. You you have been set up for f/u in 6-8 weeks

## 2024-01-17 NOTE — Op Note (Signed)
 Preoperative diagnosis: right ureteral calculus  Postoperative diagnosis: right ureteral calculus  Procedure:  Cystoscopy right ureteroscopy and stone removal Ureteroscopic laser lithotripsy right 69F x 24 ureteral stent placement with strings  right retrograde pyelography with interpretation  Surgeon: Dr. Aimee Alf  Anesthesia: General  Complications: None  Intraoperative findings:  Right proximal ureteral stone fragmented with laser All stones greater than 2 mm removed 6 x 26 double-J stent with strings placed in the left ureter  Right retrograde pyelogram interpretation: No filling defects no abnormalities contrast drains well from ureter.  EBL: Minimal  Specimens: right ureteral calculus  Disposition of specimens: [facility] for stone analysis  Indication: Joy Yates is a 63 y.o.   patient with a  right ureteral stone and associated right symptoms. After reviewing the management options for treatment, the patient elected to proceed with the above surgical procedure(s). We have discussed the potential benefits and risks of the procedure, side effects of the proposed treatment, the likelihood of the patient achieving the goals of the procedure, and any potential problems that might occur during the procedure or recuperation. Informed consent has been obtained.   Description of procedure:  The patient was taken to the operating room and general anesthesia was induced.  The patient was placed in the dorsal lithotomy position, prepped and draped in the usual sterile fashion, and preoperative antibiotics were administered. A preoperative time-out was performed.   Cystourethroscopy was performed.  The patient's urethra was examined and was normal. Pan cystoscopy was performed and the bladder systematically examined in its entirety. There was no evidence for any bladder tumors, stones, or other mucosal pathology.    Attention then turned to the right  ureteral orifice.  A 0.38 sensor guidewire was then advanced up the right ureter into the renal pelvis under fluoroscopic guidance. A 4.5 Fr semirigid ureteroscope was then advanced into the ureter next to the guidewire and the calculus was identified.   The stone was then fragmented with the 200 micron holmium laser fiber on a setting of 0.6 and frequency of 6 Hz.   All stones were then removed from the ureter with an N-gage nitinol basket.  Reinspection of the ureter revealed no remaining visible stones or fragments.   Retrograde pyelogram shows no filling defects. Contrast drained well.   The wire was then backloaded through the cystoscope and a ureteral stent was advance over the wire using Seldinger technique.  The stent was positioned appropriately under fluoroscopic and cystoscopic guidance.  The wire was then removed with an adequate stent curl noted in the renal pelvis as well as in the bladder.  The bladder was then emptied and the procedure ended.  The patient appeared to tolerate the procedure well and without complications.  The patient was able to be awakened and transferred to the recovery unit in satisfactory condition.   Disposition: The tether of the stent was left on and tucked inside the patient's vagina.  Instructions for removing the stent have been provided to the patient. The patient has been scheduled for followup in 6 weeks with a renal ultrasound.

## 2024-01-17 NOTE — Anesthesia Preprocedure Evaluation (Signed)
 Anesthesia Evaluation  Patient identified by MRN, date of birth, ID band Patient awake    Reviewed: Allergy & Precautions, NPO status , Patient's Chart, lab work & pertinent test results  Airway Mallampati: II       Dental no notable dental hx. (+) Teeth Intact, Dental Advisory Given, Caps   Pulmonary pneumonia, resolved   Pulmonary exam normal breath sounds clear to auscultation       Cardiovascular hypertension, Pt. on medications Normal cardiovascular exam Rhythm:Regular Rate:Normal     Neuro/Psych  PSYCHIATRIC DISORDERS  Depression    negative neurological ROS     GI/Hepatic Neg liver ROS,GERD  Medicated,,  Endo/Other  Obesity HLD  Renal/GU Hx/o renal calculi Right ureteral calculus  negative genitourinary   Musculoskeletal negative musculoskeletal ROS (+)    Abdominal  (+) + obese  Peds  Hematology negative hematology ROS (+)   Anesthesia Other Findings   Reproductive/Obstetrics                              Anesthesia Physical Anesthesia Plan  ASA: 2  Anesthesia Plan: General   Post-op Pain Management: Minimal or no pain anticipated, Dilaudid IV, Precedex and Tylenol  PO (pre-op)*   Induction:   PONV Risk Score and Plan: 4 or greater and Treatment may vary due to age or medical condition, Midazolam , Ondansetron  and Dexamethasone   Airway Management Planned: LMA  Additional Equipment: None  Intra-op Plan:   Post-operative Plan: Extubation in OR  Informed Consent: I have reviewed the patients History and Physical, chart, labs and discussed the procedure including the risks, benefits and alternatives for the proposed anesthesia with the patient or authorized representative who has indicated his/her understanding and acceptance.     Dental advisory given  Plan Discussed with: CRNA and Anesthesiologist  Anesthesia Plan Comments:          Anesthesia Quick  Evaluation

## 2024-01-18 ENCOUNTER — Encounter (HOSPITAL_COMMUNITY): Payer: Self-pay | Admitting: Urology

## 2024-01-24 LAB — STONE ANALYSIS
Calcium Oxalate Dihydrate: 20 %
Calcium Oxalate Monohydrate: 80 %
Weight Calculi: 6 mg

## 2024-01-25 ENCOUNTER — Ambulatory Visit: Admitting: Physician Assistant

## 2024-01-25 ENCOUNTER — Encounter: Payer: Self-pay | Admitting: Family Medicine

## 2024-02-09 ENCOUNTER — Other Ambulatory Visit: Payer: Self-pay | Admitting: Family Medicine

## 2024-03-05 ENCOUNTER — Encounter: Payer: Self-pay | Admitting: Family Medicine

## 2024-03-05 DIAGNOSIS — I10 Essential (primary) hypertension: Secondary | ICD-10-CM

## 2024-03-06 ENCOUNTER — Telehealth: Payer: Self-pay

## 2024-03-06 MED ORDER — LISINOPRIL-HYDROCHLOROTHIAZIDE 10-12.5 MG PO TABS: 1.0000 | ORAL_TABLET | Freq: Every day | ORAL | 0 refills | Status: DC

## 2024-03-06 NOTE — Telephone Encounter (Signed)
 RX's has been canceled. Cassell Mary CMA

## 2024-03-06 NOTE — Telephone Encounter (Signed)
 Red team- please call pharmacy and cancel lisinopril  10 mg. I sent in lisinopriil-hydrochlorothiazide .  Please also cancel losartan  if on file still  Thanks! Suzann Daring, MD  Family Medicine Teaching Service

## 2024-03-24 ENCOUNTER — Encounter: Payer: Self-pay | Admitting: Family Medicine

## 2024-03-24 MED ORDER — LISINOPRIL 10 MG PO TABS: 10.0000 mg | ORAL_TABLET | Freq: Every day | ORAL | 3 refills | Status: DC

## 2024-04-03 ENCOUNTER — Other Ambulatory Visit: Payer: Self-pay

## 2024-04-03 MED ORDER — OMEPRAZOLE 40 MG PO CPDR: 40.0000 mg | DELAYED_RELEASE_CAPSULE | Freq: Every day | ORAL | 0 refills | Status: AC

## 2024-05-29 NOTE — Patient Instructions (Addendum)
 It was wonderful to see you today.  Please bring ALL of your medications with you to every visit.   Today we talked about:  I sent in the combination Lisinopril - hydrochlorothiazide  and a new pill call Norvasc or Amlodipine   Send a screen shot of your blood pressure values and blood pressure pill   We will recheck your lipid testing We may consider pravastatin--this is a very good medication that prevents heart attack and stroke   To prevent stones - eat dietary rich foods with calcium  2-3 times per day  - I do not recommed restricting or supplementing calcium  - Drink plenty of water  - Limit high fructose corn syrup and excess sugar  - Avoid high doses of vitamin C  -Increase fruits and vegetables   Thank you for choosing [facility].   Please call [phone] with any questions about today's appointment.  Please be sure to schedule follow up at the front  desk before you leave today.   Suzann Daring, MD  Family Medicine

## 2024-05-30 ENCOUNTER — Encounter: Payer: Self-pay | Admitting: Family Medicine

## 2024-05-30 ENCOUNTER — Ambulatory Visit (INDEPENDENT_AMBULATORY_CARE_PROVIDER_SITE_OTHER): Admitting: Family Medicine

## 2024-05-30 VITALS — BP 145/104 | HR 61 | Ht 65.5 in | Wt 180.2 lb

## 2024-05-30 DIAGNOSIS — E785 Hyperlipidemia, unspecified: Secondary | ICD-10-CM | POA: Diagnosis not present

## 2024-05-30 DIAGNOSIS — R109 Unspecified abdominal pain: Secondary | ICD-10-CM | POA: Diagnosis not present

## 2024-05-30 DIAGNOSIS — D751 Secondary polycythemia: Secondary | ICD-10-CM | POA: Diagnosis not present

## 2024-05-30 MED ORDER — AMLODIPINE BESYLATE 5 MG PO TABS: 5.0000 mg | ORAL_TABLET | Freq: Every day | ORAL | 3 refills | Status: AC

## 2024-05-30 MED ORDER — LISINOPRIL-HYDROCHLOROTHIAZIDE 10-12.5 MG PO TABS: 1.0000 | ORAL_TABLET | Freq: Every day | ORAL | 3 refills | Status: AC

## 2024-05-30 NOTE — Progress Notes (Signed)
 Established Patient Office Visit  Subjective   Patient ID: Joy Yates, female    DOB: [birth year]  Age: 63 y.o. MRN: [id number]  Chief Complaint  Patient presents with   Follow-up    HPI  Joy Yates is a 63yo female who present to the clinic for routine follow-up on chronic medical conditions including hypertension, hyperlipidemia, history of kidney stones, and screening for GI concerns.   (1) Hypertension  The patient is present for a routine follow-up visit for management of hypertension. Patient report overall moderate blood pressure control since the last visit. Home blood pressure readings average around 130s/90s. The patient denies any recent episodes of headache, dizziness, vision changes, chest pain, shortness of breath, or palpitations. Patient report good adherence to their antihypertensive medications, which currently include Lisinopril -HCTZ. Patient is aware of the importance of low-sodium dietary habits and report following the dietary recommendations. Exercise habits include gardening and jogging. No recent hospitalizations, ER visits, or medication changes. She would like to increase dosage or add a medication to help her achieve a better control of her blood pressure.   (2) Hyperlipidemia  The patient is present for a routine follow-up visit for management of hyperlipidemia. Patient reports that she has not been taking her rosuvastatin  for past five months due to experiencing muscle cramps. Last lipid panel five months ago showed elevated LDL and triglycerides. The patient reports that she has lost weight recently with dietary modifications that include reduced saturated fats and cholesterol intake. No new cardiovascular symptoms such as chest pain, exertional dyspnea, or claudication. No new questions or concerns.   (3) History of Kidney Stones  Reports that the treatment by urologist alleviated her symptoms. Has an ultrasound scheduled to check for remaining stones.  She has reduced food high in oxalate to decrease the risk of developing stones in future. No questions or concerns today.  (4) GI Concerns  She reports that she would like be tested for H. Pylori, Celiac and IBS. Her sister has been diagnosed with Celiac and hence she would like to be screened. She denies any red-flag symptoms. She mentions that some food makes her feel bloated and her stomach gets upset, so she would life to be referred to a GI specialist for further work-up.  Review of Systems  Constitutional:  Positive for weight loss (Intentional 14lbs over past 6 months with dietary changes.). Negative for chills and fever.  Eyes:  Negative for blurred vision.  Respiratory:  Negative for cough and shortness of breath.   Cardiovascular:  Negative for chest pain, palpitations and leg swelling.  Gastrointestinal:  Negative for abdominal pain, constipation, diarrhea and vomiting.  Musculoskeletal:  Negative for myalgias.  Neurological:  Negative for dizziness, sensory change, weakness and headaches.     Objective:     BP (!) 145/104   Pulse 61   Ht 5' 5.5 (1.664 m)   Wt 180 lb 3.2 oz (81.7 kg)   SpO2 99%   BMI 29.53 kg/m    Physical Exam Constitutional:      Appearance: Normal appearance.  Cardiovascular:     Rate and Rhythm: Normal rate and regular rhythm.     Pulses: Normal pulses.     Heart sounds: Normal heart sounds. No murmur heard.    No gallop.  Pulmonary:     Effort: Pulmonary effort is normal. No respiratory distress.     Breath sounds: Normal breath sounds. No wheezing.  Neurological:     Mental Status: She is alert and  oriented to person, place, and time.  Psychiatric:        Mood and Affect: Mood normal.        Behavior: Behavior normal.      The 10-year ASCVD risk score (Arnett DK, et al., 2019) is: 7.8%    Assessment & Plan:   (1) Hypertension - Moderately controlled with current regime. Elevated in office today.  - Continue taking  Lisinopril -hydrochlorothiazide  10-12.5mg  as prescribed. - Added amlodipine 5mg . - Advised to monitor for symptoms of hypotension and seek care if needed.  - RTC in 1 month for a blood pressure recheck on new medication.   (2) Hyperlipidemia  - Has not been taking medication for past five months.  - Recent lipid profile showed elevated LDL and triglyceride levels.  - Discussed the importance of addressing high lipid levels.  - Advised to check her LDL levels today and restart medication therapy if appropriate, she agrees.   - Will wait for labs and potentially start pravastatin if indicated.   (3) History of Kidney Stones - Advised to continue avoiding food high in oxalate and vitamin C.  - Advised her to reach out to us  or the urologist if symptoms reoccur.   (4) Other  - Check CBC, BMP, and Hepatic function. - Run a screening test for Celiac today. - Send referral to GI to establish care and test for H. Pylori and IBS.  Dotty Blanch, Medical Student  [employer]  at [facility] 05/30/24 11:32 AM

## 2024-05-31 ENCOUNTER — Ambulatory Visit: Payer: Self-pay | Admitting: Family Medicine

## 2024-05-31 LAB — CBC
Hematocrit: 46.5 % (ref 34.0–46.6)
Hemoglobin: 15.6 g/dL (ref 11.1–15.9)
MCH: 30 pg (ref 26.6–33.0)
MCHC: 33.5 g/dL (ref 31.5–35.7)
MCV: 89 fL (ref 79–97)
Platelets: 270 x10E3/uL (ref 150–450)
RBC: 5.2 x10E6/uL (ref 3.77–5.28)
RDW: 12.5 % (ref 11.7–15.4)
WBC: 5.8 x10E3/uL (ref 3.4–10.8)

## 2024-05-31 LAB — BASIC METABOLIC PANEL WITH GFR
BUN/Creatinine Ratio: 19 (ref 12–28)
BUN: 14 mg/dL (ref 8–27)
CO2: 25 mmol/L (ref 20–29)
Calcium: 9.6 mg/dL (ref 8.7–10.3)
Chloride: 102 mmol/L (ref 96–106)
Creatinine, Ser: 0.73 mg/dL (ref 0.57–1.00)
Glucose: 104 mg/dL — ABNORMAL HIGH (ref 70–99)
Potassium: 4.2 mmol/L (ref 3.5–5.2)
Sodium: 142 mmol/L (ref 134–144)
eGFR: 94 mL/min/1.73 (ref >59)

## 2024-05-31 LAB — HEPATIC FUNCTION PANEL
ALT: 33 IU/L — ABNORMAL HIGH (ref 0–32)
AST: 23 IU/L (ref 0–40)
Albumin: 4.3 g/dL (ref 3.9–4.9)
Alkaline Phosphatase: 72 IU/L (ref 49–135)
Bilirubin Total: 0.7 mg/dL (ref 0.0–1.2)
Bilirubin, Direct: 0.2 mg/dL (ref 0.00–0.40)
Total Protein: 6.3 g/dL (ref 6.0–8.5)

## 2024-05-31 LAB — LDL CHOLESTEROL, DIRECT: LDL Direct: 148 mg/dL — ABNORMAL HIGH (ref 0–99)

## 2024-06-02 LAB — CELIAC DISEASE COMPREHENSIVE PANEL WITH REFLEXES
IgA/Immunoglobulin A, Serum: 111 mg/dL (ref 87–352)
Transglutaminase IgA: <2 U/mL (ref 0–3)

## 2024-06-13 DIAGNOSIS — N200 Calculus of kidney: Secondary | ICD-10-CM | POA: Diagnosis not present
